# Patient Record
Sex: Female | Born: 1950 | Race: White | Hispanic: No | Marital: Married | Smoking: Former smoker
Health system: Southern US, Community
[De-identification: ages and names within clinical notes are randomized; demographics above are authoritative.]

## PROBLEM LIST (undated history)

## (undated) DIAGNOSIS — R011 Cardiac murmur, unspecified: Secondary | ICD-10-CM

## (undated) DIAGNOSIS — M1990 Unspecified osteoarthritis, unspecified site: Secondary | ICD-10-CM

## (undated) DIAGNOSIS — F32A Depression, unspecified: Secondary | ICD-10-CM

## (undated) DIAGNOSIS — F419 Anxiety disorder, unspecified: Secondary | ICD-10-CM

## (undated) DIAGNOSIS — F329 Major depressive disorder, single episode, unspecified: Secondary | ICD-10-CM

## (undated) DIAGNOSIS — T7840XA Allergy, unspecified, initial encounter: Secondary | ICD-10-CM

## (undated) DIAGNOSIS — I454 Nonspecific intraventricular block: Secondary | ICD-10-CM

## (undated) DIAGNOSIS — K760 Fatty (change of) liver, not elsewhere classified: Secondary | ICD-10-CM

## (undated) DIAGNOSIS — G510 Bell's palsy: Secondary | ICD-10-CM

## (undated) DIAGNOSIS — K219 Gastro-esophageal reflux disease without esophagitis: Secondary | ICD-10-CM

## (undated) DIAGNOSIS — I10 Essential (primary) hypertension: Secondary | ICD-10-CM

## (undated) DIAGNOSIS — M797 Fibromyalgia: Secondary | ICD-10-CM

## (undated) DIAGNOSIS — K259 Gastric ulcer, unspecified as acute or chronic, without hemorrhage or perforation: Secondary | ICD-10-CM

## (undated) DIAGNOSIS — E079 Disorder of thyroid, unspecified: Secondary | ICD-10-CM

## (undated) DIAGNOSIS — K2270 Barrett's esophagus without dysplasia: Secondary | ICD-10-CM

## (undated) HISTORY — DX: Unspecified osteoarthritis, unspecified site: M19.90

## (undated) HISTORY — DX: Depression, unspecified: F32.A

## (undated) HISTORY — DX: Anxiety disorder, unspecified: F41.9

## (undated) HISTORY — PX: BREAST LUMPECTOMY: SHX2

## (undated) HISTORY — DX: Gastro-esophageal reflux disease without esophagitis: K21.9

## (undated) HISTORY — DX: Allergy, unspecified, initial encounter: T78.40XA

## (undated) HISTORY — DX: Cardiac murmur, unspecified: R01.1

## (undated) HISTORY — DX: Major depressive disorder, single episode, unspecified: F32.9

## (undated) HISTORY — PX: CYSTOSCOPY: SUR368

---

## 1998-02-20 ENCOUNTER — Encounter: Admission: RE | Admit: 1998-02-20 | Discharge: 1998-02-20 | Payer: Self-pay | Admitting: *Deleted

## 1998-10-02 ENCOUNTER — Other Ambulatory Visit: Admission: RE | Admit: 1998-10-02 | Discharge: 1998-10-02 | Payer: Self-pay | Admitting: Obstetrics & Gynecology

## 1999-11-18 ENCOUNTER — Other Ambulatory Visit: Admission: RE | Admit: 1999-11-18 | Discharge: 1999-11-18 | Payer: Self-pay | Admitting: Obstetrics & Gynecology

## 1999-12-24 ENCOUNTER — Encounter: Payer: Self-pay | Admitting: Obstetrics & Gynecology

## 1999-12-24 ENCOUNTER — Encounter: Admission: RE | Admit: 1999-12-24 | Discharge: 1999-12-24 | Payer: Self-pay | Admitting: Obstetrics & Gynecology

## 2000-02-21 ENCOUNTER — Other Ambulatory Visit: Admission: RE | Admit: 2000-02-21 | Discharge: 2000-02-21 | Payer: Self-pay | Admitting: Obstetrics & Gynecology

## 2000-11-20 ENCOUNTER — Other Ambulatory Visit: Admission: RE | Admit: 2000-11-20 | Discharge: 2000-11-20 | Payer: Self-pay | Admitting: Obstetrics & Gynecology

## 2001-05-02 ENCOUNTER — Other Ambulatory Visit: Admission: RE | Admit: 2001-05-02 | Discharge: 2001-05-02 | Payer: Self-pay | Admitting: Obstetrics & Gynecology

## 2002-04-24 ENCOUNTER — Other Ambulatory Visit: Admission: RE | Admit: 2002-04-24 | Discharge: 2002-04-24 | Payer: Self-pay | Admitting: Obstetrics & Gynecology

## 2002-09-04 ENCOUNTER — Ambulatory Visit (HOSPITAL_COMMUNITY): Admission: RE | Admit: 2002-09-04 | Discharge: 2002-09-04 | Payer: Self-pay | Admitting: Family Medicine

## 2002-09-04 ENCOUNTER — Encounter: Payer: Self-pay | Admitting: Family Medicine

## 2003-04-30 ENCOUNTER — Ambulatory Visit (HOSPITAL_COMMUNITY): Admission: RE | Admit: 2003-04-30 | Discharge: 2003-04-30 | Payer: Self-pay | Admitting: Family Medicine

## 2003-04-30 ENCOUNTER — Encounter: Payer: Self-pay | Admitting: Family Medicine

## 2004-01-20 ENCOUNTER — Other Ambulatory Visit: Admission: RE | Admit: 2004-01-20 | Discharge: 2004-01-20 | Payer: Self-pay | Admitting: Obstetrics & Gynecology

## 2005-01-25 ENCOUNTER — Other Ambulatory Visit: Admission: RE | Admit: 2005-01-25 | Discharge: 2005-01-25 | Payer: Self-pay | Admitting: Obstetrics & Gynecology

## 2009-12-22 ENCOUNTER — Encounter (INDEPENDENT_AMBULATORY_CARE_PROVIDER_SITE_OTHER): Payer: Self-pay | Admitting: *Deleted

## 2010-02-03 ENCOUNTER — Encounter: Admission: RE | Admit: 2010-02-03 | Discharge: 2010-02-03 | Payer: Self-pay | Admitting: Obstetrics & Gynecology

## 2010-03-05 ENCOUNTER — Encounter: Admission: RE | Admit: 2010-03-05 | Discharge: 2010-03-05 | Payer: Self-pay | Admitting: General Surgery

## 2010-03-05 ENCOUNTER — Ambulatory Visit (HOSPITAL_BASED_OUTPATIENT_CLINIC_OR_DEPARTMENT_OTHER): Admission: RE | Admit: 2010-03-05 | Discharge: 2010-03-05 | Payer: Self-pay | Admitting: General Surgery

## 2010-08-22 ENCOUNTER — Encounter: Payer: Self-pay | Admitting: Family Medicine

## 2010-09-02 NOTE — Letter (Signed)
Summary: Colonoscopy Letter  [facility]  [address] [place], [place] [address]   Phone: [phone]  Fax: [phone]      Dec 22, 2009 MRN: [id number]   [facility] [address] [place], [place]  [address]   Dear Joanna Santana,   According to your medical record, it is time for you to schedule a Colonoscopy. The American Cancer Society recommends this procedure as a method to detect early colon cancer. Patients with a family history of colon cancer, or a personal history of colon polyps or inflammatory bowel disease are at increased risk.  This letter has beeen generated based on the recommendations made at the time of your procedure. If you feel that in your particular situation this may no longer apply, please contact our office.  Please call our office at [phone] to schedule this appointment or to update your records at your earliest convenience.  Thank you for cooperating with [place] to provide you with the very best care possible.   Sincerely,  Hedwig Morton. Juanda Chance, M.D.  [facility] [phone]

## 2010-10-15 LAB — DIFFERENTIAL
Basophils Relative: 1 % (ref 0–1)
Eosinophils Absolute: 0.2 10*3/uL (ref 0.0–0.7)
Lymphocytes Relative: 23 % (ref 12–46)
Monocytes Absolute: 0.7 10*3/uL (ref 0.1–1.0)
Neutro Abs: 5.1 10*3/uL (ref 1.7–7.7)
Neutrophils Relative %: 64 % (ref 43–77)

## 2010-10-15 LAB — CBC
HCT: 42.7 % (ref 36.0–46.0)
Hemoglobin: 14.4 g/dL (ref 12.0–15.0)
MCH: 31.3 pg (ref 26.0–34.0)
MCHC: 33.7 g/dL (ref 30.0–36.0)
MCV: 92.8 fL (ref 78.0–100.0)
WBC: 7.9 10*3/uL (ref 4.0–10.5)

## 2010-10-15 LAB — BASIC METABOLIC PANEL
BUN: 19 mg/dL (ref 6–23)
Creatinine, Ser: 0.58 mg/dL (ref 0.4–1.2)
Potassium: 4.6 mEq/L (ref 3.5–5.1)
Sodium: 138 mEq/L (ref 135–145)

## 2010-10-15 LAB — GLUCOSE, CAPILLARY: Glucose-Capillary: 127 mg/dL — ABNORMAL HIGH (ref 70–99)

## 2011-03-09 ENCOUNTER — Telehealth: Payer: Self-pay | Admitting: *Deleted

## 2011-03-09 NOTE — Telephone Encounter (Signed)
Patient had a colonoscopy 01/28/03 due to family history of colon cancer in a grandparent as well as family history of colon polyps. Patient was found to have hyperplastic colon polyps at that time. Patient is now overdue for a recall colonoscopy. I have left a message for patient to call back.

## 2011-03-09 NOTE — Telephone Encounter (Signed)
-----   Message -----    From: Holli Humbles    Sent: 03/09/2011  11:51 AM      To: [name], CMA  Pt. returned call. She has transferred care and going to [facility] in [place]. Having COL on 03-11-11

## 2011-06-24 ENCOUNTER — Emergency Department (HOSPITAL_BASED_OUTPATIENT_CLINIC_OR_DEPARTMENT_OTHER)
Admission: EM | Admit: 2011-06-24 | Discharge: 2011-06-24 | Disposition: A | Discharge status: Home / Self Care | Payer: BC Managed Care – PPO | Attending: Emergency Medicine | Admitting: Emergency Medicine

## 2011-06-24 ENCOUNTER — Emergency Department (INDEPENDENT_AMBULATORY_CARE_PROVIDER_SITE_OTHER): Payer: BC Managed Care – PPO

## 2011-06-24 DIAGNOSIS — R42 Dizziness and giddiness: Secondary | ICD-10-CM

## 2011-06-24 DIAGNOSIS — I10 Essential (primary) hypertension: Secondary | ICD-10-CM | POA: Insufficient documentation

## 2011-06-24 DIAGNOSIS — R209 Unspecified disturbances of skin sensation: Secondary | ICD-10-CM

## 2011-06-24 DIAGNOSIS — E119 Type 2 diabetes mellitus without complications: Secondary | ICD-10-CM | POA: Insufficient documentation

## 2011-06-24 DIAGNOSIS — H539 Unspecified visual disturbance: Secondary | ICD-10-CM

## 2011-06-24 DIAGNOSIS — E079 Disorder of thyroid, unspecified: Secondary | ICD-10-CM | POA: Insufficient documentation

## 2011-06-24 DIAGNOSIS — Z79899 Other long term (current) drug therapy: Secondary | ICD-10-CM | POA: Insufficient documentation

## 2011-06-24 DIAGNOSIS — G510 Bell's palsy: Secondary | ICD-10-CM

## 2011-06-24 HISTORY — DX: Essential (primary) hypertension: I10

## 2011-06-24 HISTORY — DX: Disorder of thyroid, unspecified: E07.9

## 2011-06-24 LAB — DIFFERENTIAL
Basophils Relative: 0 % (ref 0–1)
Eosinophils Relative: 2 % (ref 0–5)
Monocytes Absolute: 1.1 10*3/uL — ABNORMAL HIGH (ref 0.1–1.0)

## 2011-06-24 LAB — CBC
HCT: 39.9 % (ref 36.0–46.0)
Hemoglobin: 13.7 g/dL (ref 12.0–15.0)
MCHC: 34.3 g/dL (ref 30.0–36.0)
MCV: 90.3 fL (ref 78.0–100.0)
Platelets: 265 10*3/uL (ref 150–400)
WBC: 10.8 10*3/uL — ABNORMAL HIGH (ref 4.0–10.5)

## 2011-06-24 LAB — BASIC METABOLIC PANEL
BUN: 16 mg/dL (ref 6–23)
CO2: 26 mEq/L (ref 19–32)
Creatinine, Ser: 0.5 mg/dL (ref 0.50–1.10)
GFR calc non Af Amer: >90 mL/min (ref >90)
Potassium: 3.5 mEq/L (ref 3.5–5.1)
Sodium: 141 mEq/L (ref 135–145)

## 2011-06-24 MED ORDER — MORPHINE SULFATE 4 MG/ML IJ SOLN
4.0000 mg | Freq: Once | INTRAMUSCULAR | Status: DC
  Filled 2011-06-24: qty 1

## 2011-06-24 MED ORDER — PREDNISONE 20 MG PO TABS: 60.0000 mg | ORAL_TABLET | Freq: Every day | ORAL | Status: AC

## 2011-06-24 MED ORDER — VALACYCLOVIR HCL 1 G PO TABS: 1000.0000 mg | ORAL_TABLET | Freq: Three times a day (TID) | ORAL | Status: AC

## 2011-06-24 MED ORDER — PREDNISONE 20 MG PO TABS: 60.0000 mg | ORAL_TABLET | Freq: Every day | ORAL | Status: DC

## 2011-06-24 NOTE — ED Notes (Signed)
dizziness-being treated for bilateral ear infections-c/o HA, visual disturbance, light headed, left eye not blinking

## 2011-06-25 NOTE — ED Provider Notes (Addendum)
History     CSN: [id number] Arrival date & time: 06/24/2011  4:50 PM   First MD Initiated Contact with Patient 06/24/11 1748      CC: Eye Problem, Headache   HPI  Ho HTN, NIDDM pw multiple complaints. Pt states she has experience b/l ear pain and slightly dec hearing, lightheadedness x several days and recently finished course of abx for "double ear infection". Since that time she has experienced intense pain Lt ear, headache (which she describes as intermittent, gradual onset Lt forehead only). Baseline blurry vision per pt (otherwise noted in nsg note). She is not wearing her bifocals today. States that approx 1-2 hours pta  She began to have difficulty with ?closing her lt eye. Feels that her blinking is "off". Also c/o Lt facial numbness and Lt tongue numbness. No slurred speech, facial droop, or difficulty with swallowing. Denies numbness/tingling/weaknss of her extremities. Ambulatory. No h/o CVA in past. No recent tick bite  Angeline Slim, RN 06/24/2011 16:39  dizziness-being treated for bilateral ear infections-c/o HA, visual disturbance, light headed, left eye not blinking    Past Medical History  Diagnosis Date  . Hypertension   . Diabetes mellitus   . Thyroid disease     Past Surgical History  Procedure Date  . Bladder repair w/ cesarean section     No family history on file.  History  Substance Use Topics  . Smoking status: Never Smoker   . Smokeless tobacco: Not on file  . Alcohol Use: No    OB History    Grav Para Term Preterm Abortions TAB SAB Ect Mult Living                  Review of Systems  All other systems reviewed and are negative.   except as noted HPI   Allergies  Codeine; Ibuprofen; Novocain; and Penicillins cross reactors  Home Medications   Current Outpatient Rx  Name Route Sig Dispense Refill  . ASPIRIN EC 81 MG PO TBEC Oral Take 81 mg by mouth daily.      Marland Kitchen LEVOTHYROXINE SODIUM 50 MCG PO TABS Oral Take 50 mcg by mouth  daily.      Marland Kitchen METFORMIN HCL 500 MG PO TABS Oral Take 500 mg by mouth daily as needed. For high blood sugar     . TELMISARTAN-HCTZ 80-12.5 MG PO TABS Oral Take 1 tablet by mouth daily.      . TRAMADOL HCL 50 MG PO TABS Oral Take 50-100 mg by mouth 3 (three) times daily as needed. For sleep. Maximum dose= 8 tablets per day     . PREDNISONE 20 MG PO TABS Oral Take 3 tablets (60 mg total) by mouth daily. 21 tablet 0  . VALACYCLOVIR HCL 1 G PO TABS Oral Take 1 tablet (1,000 mg total) by mouth 3 (three) times daily. 21 tablet 0    06/24/11 1639 99.6 (37.6) 101 20 ! 176/108 98 200 lb (90.719 kg)  Physical Exam  Nursing note and vitals reviewed. Constitutional: She is oriented to person, place, and time. She appears well-developed.  HENT:  Head: Atraumatic.  Mouth/Throat: Oropharynx is clear and moist.  Eyes: Conjunctivae and EOM are normal. Pupils are equal, round, and reactive to light. Right eye exhibits no discharge. Left eye exhibits no discharge.  Neck: Normal range of motion. Neck supple.  Cardiovascular: Normal rate, regular rhythm, normal heart sounds and intact distal pulses.   Pulmonary/Chest: Effort normal and breath sounds normal. No respiratory distress.  She has no wheezes. She has no rales.  Abdominal: Soft. She exhibits no distension. There is no tenderness. There is no rebound and no guarding.  Musculoskeletal: Normal range of motion.  Neurological: She is alert and oriented to person, place, and time.       CN III-XII intact except as noted Strength 5/5 all extremities No pronator drift Lt facial droop at rest and with talking but smile symmetric Unable to fully resist Lt eye opening Forehead assymetric eyebrow raising, Lt lower than right Tongue midline  Sensation nl V1/2 ? Slightly dec V3 (pt unsure( EOMI   Skin: Skin is warm and dry. No rash noted.  Psychiatric: She has a normal mood and affect.    ED Course  Procedures (including critical care time)  Labs  Reviewed  CBC - Abnormal; Notable for the following:    WBC 10.8 (*)    All other components within normal limits  DIFFERENTIAL - Abnormal; Notable for the following:    Monocytes Absolute 1.1 (*)    All other components within normal limits  BASIC METABOLIC PANEL - Abnormal; Notable for the following:    Glucose, Bld 136 (*)    All other components within normal limits   Ct Head Wo Contrast  06/24/2011  *RADIOLOGY REPORT*  Clinical Data: Left facial numbness with lightheadedness and visual disturbance.  CT HEAD WITHOUT CONTRAST  Technique:  Contiguous axial images were obtained from the base of the skull through the vertex without contrast.  Comparison: None.  Findings: No evidence of acute infarct, acute hemorrhage, mass lesion, mass effect or hydrocephalus.  Visualized portions of the paranasal sinuses and mastoid air cells are clear.  IMPRESSION: No acute intracranial abnormality.  Original Report Authenticated By: Reyes Ivan, M.D.     1. Bell's palsy       MDM  Concern for Atypical bell's palsy. The Patient's si/sx have progressed since arrival to be more c/w Bell's palsy. DDx inc acoustic neuroma, less likely CVA, less likely cavernous sinus thrombosis. Her headache is intermittent and she is declining analgesia here. Her BP and mild tachycardia improved without intervention.  BP 123/79  Pulse 70  Temp(Src) 98.9 F (37.2 C) (Oral)  Resp 16  Ht 5\' 1"  (1.549 m)  Wt 200 lb (90.719 kg)  BMI 37.79 kg/m2  SpO2 96%   I have discussed the case with neurology on call. Agree would be atypical for CVA. Continue ASA 81mg  daily. Outpatient MRI. Patient comfortable with plan. Will come in tomorrow morning for MRI. Given strict precautions for return. Acyclovir, prednisone. Will monitor her glucose carefully at home. Her eye feels dry. Given eye patch and advised re: lubricating Lt eye. Patient and husband comfortable with the plan.          Forbes Cellar, MD 06/25/11  1610  Forbes Cellar, MD 06/25/11 [phone]

## 2011-07-04 ENCOUNTER — Other Ambulatory Visit: Payer: Self-pay | Admitting: Family Medicine

## 2011-07-08 ENCOUNTER — Other Ambulatory Visit: Payer: BC Managed Care – PPO

## 2011-09-27 ENCOUNTER — Other Ambulatory Visit: Payer: Self-pay | Admitting: Obstetrics & Gynecology

## 2011-09-27 DIAGNOSIS — N644 Mastodynia: Secondary | ICD-10-CM

## 2011-10-05 ENCOUNTER — Ambulatory Visit
Admission: RE | Admit: 2011-10-05 | Discharge: 2011-10-05 | Disposition: A | Discharge status: Home / Self Care | Payer: BC Managed Care – PPO | Source: Ambulatory Visit | Attending: Obstetrics & Gynecology | Admitting: Obstetrics & Gynecology

## 2011-10-05 ENCOUNTER — Other Ambulatory Visit: Payer: Self-pay | Admitting: Obstetrics & Gynecology

## 2011-10-05 DIAGNOSIS — N644 Mastodynia: Secondary | ICD-10-CM

## 2011-12-08 ENCOUNTER — Other Ambulatory Visit: Payer: Self-pay | Admitting: Otolaryngology

## 2011-12-08 DIAGNOSIS — R220 Localized swelling, mass and lump, head: Secondary | ICD-10-CM

## 2011-12-14 ENCOUNTER — Ambulatory Visit
Admission: RE | Admit: 2011-12-14 | Discharge: 2011-12-14 | Disposition: A | Discharge status: Home / Self Care | Payer: BC Managed Care – PPO | Source: Ambulatory Visit | Attending: Otolaryngology | Admitting: Otolaryngology

## 2011-12-14 DIAGNOSIS — R221 Localized swelling, mass and lump, neck: Secondary | ICD-10-CM

## 2011-12-14 MED ORDER — IOHEXOL 300 MG/ML  SOLN
75.0000 mL | Freq: Once | INTRAMUSCULAR | Status: AC | PRN
  Administered 2011-12-14: 75 mL via INTRAVENOUS

## 2012-06-05 ENCOUNTER — Other Ambulatory Visit: Payer: Self-pay | Admitting: Family Medicine

## 2012-08-06 ENCOUNTER — Other Ambulatory Visit: Payer: BC Managed Care – PPO

## 2014-04-21 ENCOUNTER — Ambulatory Visit
Admission: RE | Admit: 2014-04-21 | Discharge: 2014-04-21 | Disposition: A | Discharge status: Home / Self Care | Payer: BC Managed Care – PPO | Source: Ambulatory Visit | Attending: Family Medicine | Admitting: Family Medicine

## 2014-04-21 ENCOUNTER — Other Ambulatory Visit: Payer: Self-pay | Admitting: Family Medicine

## 2014-04-21 DIAGNOSIS — M25512 Pain in left shoulder: Secondary | ICD-10-CM

## 2014-05-09 ENCOUNTER — Emergency Department (HOSPITAL_BASED_OUTPATIENT_CLINIC_OR_DEPARTMENT_OTHER)
Admission: EM | Admit: 2014-05-09 | Discharge: 2014-05-09 | Disposition: A | Discharge status: Home / Self Care | Payer: BC Managed Care – PPO | Attending: Emergency Medicine | Admitting: Emergency Medicine

## 2014-05-09 ENCOUNTER — Encounter (HOSPITAL_BASED_OUTPATIENT_CLINIC_OR_DEPARTMENT_OTHER): Payer: Self-pay | Admitting: Emergency Medicine

## 2014-05-09 ENCOUNTER — Emergency Department (HOSPITAL_BASED_OUTPATIENT_CLINIC_OR_DEPARTMENT_OTHER): Payer: BC Managed Care – PPO

## 2014-05-09 DIAGNOSIS — Z8719 Personal history of other diseases of the digestive system: Secondary | ICD-10-CM | POA: Insufficient documentation

## 2014-05-09 DIAGNOSIS — I10 Essential (primary) hypertension: Secondary | ICD-10-CM | POA: Diagnosis not present

## 2014-05-09 DIAGNOSIS — Z79899 Other long term (current) drug therapy: Secondary | ICD-10-CM | POA: Insufficient documentation

## 2014-05-09 DIAGNOSIS — Z87891 Personal history of nicotine dependence: Secondary | ICD-10-CM | POA: Insufficient documentation

## 2014-05-09 DIAGNOSIS — Z8669 Personal history of other diseases of the nervous system and sense organs: Secondary | ICD-10-CM | POA: Diagnosis not present

## 2014-05-09 DIAGNOSIS — R519 Headache, unspecified: Secondary | ICD-10-CM

## 2014-05-09 DIAGNOSIS — R51 Headache: Secondary | ICD-10-CM | POA: Diagnosis not present

## 2014-05-09 DIAGNOSIS — H9313 Tinnitus, bilateral: Secondary | ICD-10-CM | POA: Diagnosis not present

## 2014-05-09 DIAGNOSIS — Z880 Allergy status to penicillin: Secondary | ICD-10-CM | POA: Insufficient documentation

## 2014-05-09 DIAGNOSIS — E119 Type 2 diabetes mellitus without complications: Secondary | ICD-10-CM | POA: Diagnosis not present

## 2014-05-09 DIAGNOSIS — Z7982 Long term (current) use of aspirin: Secondary | ICD-10-CM | POA: Diagnosis not present

## 2014-05-09 DIAGNOSIS — E079 Disorder of thyroid, unspecified: Secondary | ICD-10-CM | POA: Diagnosis not present

## 2014-05-09 HISTORY — DX: Nonspecific intraventricular block: I45.4

## 2014-05-09 HISTORY — DX: Fibromyalgia: M79.7

## 2014-05-09 HISTORY — DX: Fatty (change of) liver, not elsewhere classified: K76.0

## 2014-05-09 HISTORY — DX: Bell's palsy: G51.0

## 2014-05-09 HISTORY — DX: Gastric ulcer, unspecified as acute or chronic, without hemorrhage or perforation: K25.9

## 2014-05-09 HISTORY — DX: Barrett's esophagus without dysplasia: K22.70

## 2014-05-09 NOTE — ED Notes (Signed)
Patient transported to CT 

## 2014-05-09 NOTE — ED Provider Notes (Signed)
CSN: [id number]     Arrival date & time 05/09/14  1314 History   First MD Initiated Contact with Patient 05/09/14 1342     Chief Complaint  Patient presents with  . Headache    Head pressure     (Consider location/radiation/quality/duration/timing/severity/associated sxs/prior Treatment) Patient is a 63 y.o. female presenting with headaches. The history is provided by the patient.  Headache Pain location:  Generalized Quality: pressure. Severity currently:  0/10 Severity at highest:  10/10 Onset quality:  Sudden Duration: several seconds. Timing:  Constant Progression:  Resolved Chronicity:  Recurrent Similar to prior headaches: yes (happened once 2 weeks ago, simlar to that episode, but less intense)   Relieved by:  Nothing Worsened by:  Nothing tried Associated symptoms: no abdominal pain, no cough, no diarrhea, no dizziness, no fatigue, no fever, no neck stiffness, no numbness and no vomiting     Past Medical History  Diagnosis Date  . Hypertension   . Diabetes mellitus   . Thyroid disease   . Fibromyalgia   . Bell's palsy   . Gastric ulcer   . Barrett's esophagus   . Fatty liver   . BBB (bundle branch block)     right   Past Surgical History  Procedure Laterality Date  . Cystoscopy    . Cesarean section     No family history on file. History  Substance Use Topics  . Smoking status: Former Games developermoker  . Smokeless tobacco: Not on file  . Alcohol Use: No   OB History   Grav Para Term Preterm Abortions TAB SAB Ect Mult Living                 Review of Systems  Constitutional: Negative for fever, chills and fatigue.  Respiratory: Negative for cough.   Cardiovascular: Negative for chest pain and leg swelling.  Gastrointestinal: Negative for vomiting, abdominal pain and diarrhea.  Musculoskeletal: Negative for neck stiffness.  Neurological: Positive for headaches. Negative for dizziness and numbness.  All other systems reviewed and are  negative.     Allergies  Codeine; Ibuprofen; Penicillins cross reactors; and Procaine hcl  Home Medications   Prior to Admission medications   Medication Sig Start Date End Date Taking? Authorizing Provider  levothyroxine (SYNTHROID, LEVOTHROID) 50 MCG tablet Take 50 mcg by mouth daily.     Yes Historical Provider, MD  metFORMIN (GLUCOPHAGE) 500 MG tablet Take 500 mg by mouth daily as needed. For high blood sugar    Yes Historical Provider, MD  OMEPRAZOLE PO Take by mouth.   Yes Historical Provider, MD  telmisartan-hydrochlorothiazide (MICARDIS HCT) 80-12.5 MG per tablet Take 1 tablet by mouth daily.     Yes Historical Provider, MD  TIZANIDINE HCL PO Take by mouth.   Yes Historical Provider, MD  traMADol (ULTRAM) 50 MG tablet Take 50-100 mg by mouth 3 (three) times daily as needed. For sleep. Maximum dose= 8 tablets per day    Yes Historical Provider, MD  aspirin EC 81 MG tablet Take 81 mg by mouth daily.      Historical Provider, MD   BP 147/85  Pulse 93  Temp(Src) 98.2 F (36.8 C) (Oral)  Resp 18  Ht 5\' 1"  (1.549 m)  Wt 200 lb (90.719 kg)  BMI 37.81 kg/m2  SpO2 98% Physical Exam  Nursing note and vitals reviewed. Constitutional: She is oriented to person, place, and time. She appears well-developed and well-nourished. No distress.  HENT:  Head: Normocephalic and atraumatic.  Mouth/Throat: Oropharynx  is clear and moist.  Eyes: EOM are normal. Pupils are equal, round, and reactive to light.  Neck: Normal range of motion. Neck supple.  Cardiovascular: Normal rate and regular rhythm.  Exam reveals no friction rub.   No murmur heard. Pulmonary/Chest: Effort normal and breath sounds normal. No respiratory distress. She has no wheezes. She has no rales.  Abdominal: Soft. She exhibits no distension. There is no tenderness. There is no rebound.  Musculoskeletal: Normal range of motion. She exhibits no edema.  Neurological: She is alert and oriented to person, place, and time.   Skin: No rash noted. She is not diaphoretic.    ED Course  Procedures (including critical care time) Labs Review Labs Reviewed - No data to display  Imaging Review No results found.   EKG Interpretation None      MDM   Final diagnoses:  Acute nonintractable headache, unspecified headache type    89F here with headache. Lasted a few seconds today.  Described as intense pressure sensation with some roaring in her ears. Began while talking on the phone today. Lasted a few seconds, followed by generalized chill than spread down from her neck into the rest of her body. Patient had similar episode 2 weeks ago during the middle of the night. This episode today was not as intense as the one 2 weeks ago. Denies any dizziness, blurry vision, CP, SOB. Here states she feels well, no issues. Normal Neuro exam. Will do Head CT to look for possible SAH.  CT ok. Within 6 hrs of HA onset, no need for LP. Counseled on return precautions, f/u plan. Patient feeling great now, no need for further testing. Stable for discharge.   Elwin MochaBlair Jim Lundin, MD 05/09/14 1444

## 2014-05-09 NOTE — Discharge Instructions (Signed)

## 2014-05-09 NOTE — ED Notes (Signed)
Patient states she had a sudden onset of bilateral ear pain with  head pressure which floated down her chest and disappeared.  Lasted only a few seconds.  States she had a similar experience two weeks ago and discussed with her pcp, and was told to come to the ed if it happens again.  States she had Bell's palsey three years ago and has had problems with her ears every since, and has had left ear pain for the last few weeks.

## 2014-06-04 ENCOUNTER — Encounter: Payer: Self-pay | Admitting: Neurology

## 2014-06-04 ENCOUNTER — Ambulatory Visit (INDEPENDENT_AMBULATORY_CARE_PROVIDER_SITE_OTHER): Payer: BC Managed Care – PPO | Admitting: Neurology

## 2014-06-04 VITALS — BP 124/76 | HR 92 | Ht 61.5 in | Wt 210.0 lb

## 2014-06-04 DIAGNOSIS — H9312 Tinnitus, left ear: Secondary | ICD-10-CM

## 2014-06-04 DIAGNOSIS — R519 Headache, unspecified: Secondary | ICD-10-CM

## 2014-06-04 DIAGNOSIS — R51 Headache: Secondary | ICD-10-CM

## 2014-06-04 DIAGNOSIS — H9202 Otalgia, left ear: Secondary | ICD-10-CM

## 2014-06-04 DIAGNOSIS — Z8669 Personal history of other diseases of the nervous system and sense organs: Secondary | ICD-10-CM

## 2014-06-04 NOTE — Patient Instructions (Addendum)
I really can't say what the cause of your symptoms are.  It may be related to muscle tension in the neck or residual pain from bell's palsy.  We will geset MRI of the brain with and without contrast, MRA of the head without contrast and MRA of the neck with contrast to rule out other secondary causes.  I want to see you back in 4 to 6 weeks for re-evaluation.  You may take a benzodiazepam prior to the study but you must have somebody drive you.

## 2014-06-04 NOTE — Progress Notes (Signed)
NEUROLOGY CONSULTATION NOTE  Joanna Santana MRN: [id number] DOB: [birth year]  Referring provider: Dr. Tiburcio PeaHarris Primary care provider: Dr. Tiburcio PeaHarris  Reason for consult:  Headache  HISTORY OF PRESENT ILLNESS: Joanna Santana is a [age] year old right-handed woman with history of hypertension, type II diabetes, hypothyroidism, fibromyalgia, Bell's palsy, gastric ulcer, RBBB and fatty liver who presents for headache.  ED records reviewed.  On 04/18/14, she was laying in bed in the middle of the night when suddenly she developed this intense sensation on both sides of the neck which travelled up to the ears and jaw.  It wasn't a pain but rather a wave-like, whooshing sensation.  It was associated with aural fullness, muffled sounds, and ear pain.  It lasted only seconds.  It really startled her.  There was no associated numbness, weakness, dizziness or visual symptoms.  She had another episode on 05/09/14.  It occurred while she was talking on the telephone.  It was not as intense.  She went to the ED.  She had a CT of the head performed, which was negative for subarachnoid bleed, acute infarct or mass lesions.  She was feeling fine, so she was discharged.  She had another episode on 05/14/14.  It was associated with a stinging left earache and sensation of fluttering in the left ear.  She also reports ringing in the ears.  She went to the ENT and she had a normal evaluation.  She had a mild episode a few days ago.  She had an episode of left Bell's palsy in November 2012.  She had facial weakness lasting for a year.  She also has fibromyalgia, which includes pain in the neck.  For several months, she has been receiving alpha stim therapy to help her sleep and control the pain.  She was evaluated by her PT and was told that her sternocleidomastoid muscles were very tight.  Occasionally, she gets muscle spasms in her sternocleidomastoid muscles.  She denies any preceding viral illness.  PAST MEDICAL  HISTORY: Past Medical History  Diagnosis Date  . Hypertension   . Diabetes mellitus   . Thyroid disease   . Fibromyalgia   . Bell's palsy   . Gastric ulcer   . Barrett's esophagus   . Fatty liver   . BBB (bundle branch block)     right    PAST SURGICAL HISTORY: Past Surgical History  Procedure Laterality Date  . Cystoscopy    . Cesarean section    . Breast lumpectomy      benign    MEDICATIONS: Current Outpatient Prescriptions on File Prior to Visit  Medication Sig Dispense Refill  . metFORMIN (GLUCOPHAGE) 500 MG tablet Take 1,000 mg by mouth 2 (two) times daily with a meal. For high blood sugar    . OMEPRAZOLE PO Take 40 mg by mouth daily.     Marland Kitchen. TIZANIDINE HCL PO Take 2 mg by mouth daily.     . traMADol (ULTRAM) 50 MG tablet Take 50-100 mg by mouth 3 (three) times daily as needed. For sleep. Maximum dose= 8 tablets per day      No current facility-administered medications on file prior to visit.    ALLERGIES: Allergies  Allergen Reactions  . Codeine Anxiety and Rash  . Ibuprofen Rash  . Penicillins Cross Reactors Rash  . Procaine Hcl Palpitations    FAMILY HISTORY: No family history on file.  SOCIAL HISTORY: History   Social History  . Marital Status: Married  Spouse Name: N/A    Number of Children: N/A  . Years of Education: N/A   Occupational History  . Not on file.   Social History Main Topics  . Smoking status: Former Smoker    Quit date: 06/04/1994  . Smokeless tobacco: Not on file  . Alcohol Use: No  . Drug Use: No  . Sexual Activity: Not on file   Other Topics Concern  . Not on file   Social History Narrative    REVIEW OF SYSTEMS: Constitutional: No fevers, chills, or sweats, no generalized fatigue, change in appetite Eyes: No visual changes, double vision, eye pain Ear, nose and throat: No hearing loss, ear pain, nasal congestion, sore throat Cardiovascular: No chest pain, palpitations Respiratory:  No shortness of breath at  rest or with exertion, wheezes GastrointestinaI: No nausea, vomiting, diarrhea, abdominal pain, fecal incontinence Genitourinary:  No dysuria, urinary retention or frequency Musculoskeletal:  Generalized pain Integumentary: No rash, pruritus, skin lesions Neurological: as above Psychiatric: No depression, insomnia, anxiety Endocrine: No palpitations, fatigue, diaphoresis, mood swings, change in appetite, change in weight, increased thirst Hematologic/Lymphatic:  No anemia, purpura, petechiae. Allergic/Immunologic: no itchy/runny eyes, nasal congestion, recent allergic reactions, rashes  PHYSICAL EXAM: Filed Vitals:   06/04/14 0800  BP: 124/76  Pulse: 92   General: No acute distress Head:  Normocephalic/atraumatic Neck: supple, no paraspinal tenderness, full range of motion Back: No paraspinal tenderness Heart: regular rate and rhythm Lungs: Clear to auscultation bilaterally. Vascular: No carotid bruits. Neurological Exam: Mental status: alert and oriented to person, place, and time, recent and remote memory intact, fund of knowledge intact, attention and concentration intact, speech fluent and not dysarthric, language intact. Cranial nerves: CN I: not tested CN II: pupils equal, round and reactive to light, visual fields intact, fundi unremarkable, without vessel changes, exudates, hemorrhages or papilledema. CN III, IV, VI:  full range of motion, no nystagmus, no ptosis CN V: facial sensation intact CN VII: upper and lower face symmetric CN VIII: hearing intact CN IX, X: gag intact, uvula midline CN XI: sternocleidomastoid and trapezius muscles intact CN XII: tongue midline Bulk & Tone: normal, no fasciculations. Motor: 5/5 throughout Sensation:  Temperature and vibration intact Deep Tendon Reflexes:  2+ throughout, toes downgoing Finger to nose testing: no dysmetria Heel to shin:  No dysmetria Gait:  Normal station and stride.  Able to turn and walk in tandem. Romberg  negative.  IMPRESSION: Unusual sensation of the head, mostly on the left side, associated with earache and tinnitus.  It is not really a headache.  I cannot really classify it.  It could possibly be due to muscle spasms in the neck or neuralgia involving the left ear related to prior Bell's palsy, causing radiating discomfort Tinnitus Left earache Pulsatile tinnitus History of left Bell's palsy  PLAN: I would like to rule out secondary causes of headache and pulsatile tinnitus.  We will get MRI of the brain with and without contrast, MRA of the head and MRA of the neck.  She will follow up afterwards.    45 minutes spent with patient, over 50% spent discussing etiologies and coordinating care.  Thank you for allowing me to take part in the care of this patient.  Shon MilletAdam Fard Borunda, DO  CC:  Johny BlamerWilliam Harris, MD

## 2014-06-13 ENCOUNTER — Other Ambulatory Visit: Payer: BC Managed Care – PPO

## 2014-06-17 ENCOUNTER — Telehealth: Payer: Self-pay | Admitting: Neurology

## 2014-06-17 NOTE — Telephone Encounter (Signed)
Pt called regarding the MRI she is [facility] to [facility]. Pt wants to know if she can be scheduled at [facility] for her to have an Open MRI. She also has questions and concerns to go over regarding her MRI. Please call pt # [phone]

## 2014-06-17 NOTE — Telephone Encounter (Signed)
Patient is not sure is she wants to have MRI MRA's done due to not wanting to stay in machine as long as needed  She is going to go by [facility] and look at there machine and call insurance and see if she can have it done on 3 different days she will call me back and let me know

## 2014-06-24 ENCOUNTER — Telehealth: Payer: Self-pay | Admitting: Neurology

## 2014-06-24 NOTE — Telephone Encounter (Signed)
Pt called wanting to speak to a nurse regarding her upcoming MRA & MRI. Please call pt # [phone]

## 2014-07-02 ENCOUNTER — Inpatient Hospital Stay: Admission: RE | Admit: 2014-07-02 | Payer: BC Managed Care – PPO | Source: Ambulatory Visit

## 2014-07-02 ENCOUNTER — Other Ambulatory Visit: Payer: BC Managed Care – PPO

## 2014-07-05 ENCOUNTER — Other Ambulatory Visit: Payer: BC Managed Care – PPO

## 2014-07-30 ENCOUNTER — Telehealth: Payer: Self-pay | Admitting: *Deleted

## 2014-07-30 NOTE — Telephone Encounter (Signed)
Patient canceled follow up appointment will call to reschedule at a later time 

## 2014-07-30 NOTE — Telephone Encounter (Signed)
See below note.

## 2014-08-04 ENCOUNTER — Ambulatory Visit: Payer: BC Managed Care – PPO | Admitting: Neurology

## 2014-10-07 ENCOUNTER — Ambulatory Visit (INDEPENDENT_AMBULATORY_CARE_PROVIDER_SITE_OTHER): Payer: BC Managed Care – PPO | Admitting: Physician Assistant

## 2014-10-07 VITALS — BP 99/82 | HR 96 | Temp 98.9°F | Resp 18 | Ht 62.0 in | Wt 210.0 lb

## 2014-10-07 DIAGNOSIS — I10 Essential (primary) hypertension: Secondary | ICD-10-CM | POA: Insufficient documentation

## 2014-10-07 DIAGNOSIS — R0981 Nasal congestion: Secondary | ICD-10-CM

## 2014-10-07 DIAGNOSIS — E119 Type 2 diabetes mellitus without complications: Secondary | ICD-10-CM | POA: Insufficient documentation

## 2014-10-07 DIAGNOSIS — E039 Hypothyroidism, unspecified: Secondary | ICD-10-CM | POA: Insufficient documentation

## 2014-10-07 DIAGNOSIS — K2270 Barrett's esophagus without dysplasia: Secondary | ICD-10-CM | POA: Insufficient documentation

## 2014-10-07 DIAGNOSIS — J029 Acute pharyngitis, unspecified: Secondary | ICD-10-CM | POA: Diagnosis not present

## 2014-10-07 DIAGNOSIS — D72829 Elevated white blood cell count, unspecified: Secondary | ICD-10-CM

## 2014-10-07 DIAGNOSIS — M797 Fibromyalgia: Secondary | ICD-10-CM | POA: Insufficient documentation

## 2014-10-07 LAB — POCT CBC
Granulocyte percent: 70.3 %G (ref 37–80)
HEMATOCRIT: 40.8 % (ref 37.7–47.9)
HEMOGLOBIN: 13.6 g/dL (ref 12.2–16.2)
Lymph, poc: 2.6 (ref 0.6–3.4)
MCH: 30.7 pg (ref 27–31.2)
MCHC: 33.4 g/dL (ref 31.8–35.4)
MCV: 91.9 fL (ref 80–97)
MID (cbc): 0.8 (ref 0–0.9)
MPV: 7.3 fL (ref 0–99.8)
POC Granulocyte: 8 — AB (ref 2–6.9)
POC LYMPH PERCENT: 23 %L (ref 10–50)
POC MID %: 6.7 %M (ref 0–12)
Platelet Count, POC: 306 10*3/uL (ref 142–424)
RBC: 4.44 M/uL (ref 4.04–5.48)
RDW, POC: 13.5 %
WBC: 11.4 10*3/uL — AB (ref 4.6–10.2)

## 2014-10-07 LAB — POCT RAPID STREP A (OFFICE): RAPID STREP A SCREEN: NEGATIVE

## 2014-10-07 MED ORDER — AZITHROMYCIN 250 MG PO TABS: 250.0000 mg | ORAL_TABLET | Freq: Every day | ORAL | Status: AC

## 2014-10-07 MED ORDER — TRIAMCINOLONE ACETONIDE 55 MCG/ACT NA AERO: 2.0000 | INHALATION_SPRAY | Freq: Every day | NASAL | Status: AC

## 2014-10-07 NOTE — Progress Notes (Signed)
Subjective:    Patient ID: Joanna Santana, female    DOB: 1951/01/18, 64 y.o.   MRN: [id number]  HPI   Sick for about 1 week - started with a scratchy throat that has not improved over the last week and then this am her throat is burning and raw.  She has some mild PND and nasal congestion and over the last 3 days she has had a dry cough.  She has not had exposure to strep that she knows of but she does hug many people at church on Sundays so she may have been exposed to something that she does not know about.  She has some problems with allergies and yesterday prior to her throat getting worse she did work in th yard.  She does not take anything for her allergies.  She has h/o fibromyalgia and is currently not working due to this condition.  She sees her PCP for treatment.  She had to be taken off her Lyrica due to increase in LFTs and she thinks she has been on Cymbalta but she stopped it from some reason.  She has an EGD on Friday and she wonders if she will be able to go due to this illness.  Patient Active Problem List   Diagnosis Date Noted  . Barrett's esophagus 10/07/2014  . Hypothyroidism 10/07/2014  . Benign essential HTN 10/07/2014  . Diabetes type 2, controlled 10/07/2014  . Fibromyalgia 10/07/2014   Prior to Admission medications   Medication Sig Start Date End Date Taking? Authorizing Provider  levothyroxine (SYNTHROID, LEVOTHROID) 75 MCG tablet Take 75 mcg by mouth daily before breakfast.   Yes Historical Provider, MD  metFORMIN (GLUCOPHAGE) 500 MG tablet Take 1,000 mg by mouth 2 (two) times daily with a meal. For high blood sugar   Yes Historical Provider, MD  OMEPRAZOLE PO Take 40 mg by mouth daily.    Yes Historical Provider, MD  TIZANIDINE HCL PO Take 2 mg by mouth daily.    Yes Historical Provider, MD  traMADol (ULTRAM) 50 MG tablet Take 50-100 mg by mouth 3 (three) times daily as needed. For sleep. Maximum dose= 8 tablets per day    Yes Historical Provider, MD    valsartan-hydrochlorothiazide (DIOVAN-HCT) 160-25 MG per tablet Take 1 tablet by mouth daily.   Yes Historical Provider, MD   Allergies  Allergen Reactions  . Codeine Anxiety and Rash  . Ibuprofen Rash  . Penicillins Cross Reactors Rash  . Procaine Hcl Palpitations    Medications, allergies, past medical history, surgical history, family history, social history and problem list reviewed and updated.  Review of Systems  Constitutional: Negative for fever and chills.  HENT: Positive for congestion, rhinorrhea and sore throat. Negative for postnasal drip.   Respiratory: Positive for cough.   Cardiovascular: Negative for chest pain.  Gastrointestinal: Negative for nausea and vomiting.  Musculoskeletal: Positive for myalgias (normal for her).  Neurological: Positive for headaches (mild).       Objective:   Physical Exam  Constitutional: She is oriented to person, place, and time. She appears well-developed and well-nourished.  BP 99/82 mmHg  Pulse 96  Temp(Src) 98.9 F (37.2 C) (Oral)  Resp 18  Ht 5\' 2"  (1.575 m)  Wt 210 lb (95.255 kg)  BMI 38.40 kg/m2  SpO2 97%   HENT:  Head: Normocephalic and atraumatic.  Right Ear: Hearing, tympanic membrane, external ear and ear canal normal.  Left Ear: Hearing, tympanic membrane, external ear and ear canal normal.  Nose: Mucosal edema (red) present.  Mouth/Throat: Uvula is midline and mucous membranes are normal. Posterior oropharyngeal edema present. No oropharyngeal exudate or posterior oropharyngeal erythema.  Eyes: Conjunctivae are normal.  Cardiovascular: Normal rate, regular rhythm and normal heart sounds.   No murmur heard. Pulmonary/Chest: Effort normal and breath sounds normal. She has no wheezes.  Neurological: She is alert and oriented to person, place, and time.  Skin: Skin is warm and dry.  Psychiatric: She has a normal mood and affect. Her behavior is normal. Judgment and thought content normal.   Results for orders  placed or performed in visit on 10/07/14  POCT rapid strep A  Result Value Ref Range   Rapid Strep A Screen Negative Negative  POCT CBC  Result Value Ref Range   WBC 11.4 (A) 4.6 - 10.2 K/uL   Lymph, poc 2.6 0.6 - 3.4   POC LYMPH PERCENT 23.0 10 - 50 %L   MID (cbc) 0.8 0 - 0.9   POC MID % 6.7 0 - 12 %M   POC Granulocyte 8.0 (A) 2 - 6.9   Granulocyte percent 70.3 37 - 80 %G   RBC 4.44 4.04 - 5.48 M/uL   Hemoglobin 13.6 12.2 - 16.2 g/dL   HCT, POC 16.1 09.6 - 47.9 %   MCV 91.9 80 - 97 fL   MCH, POC 30.7 27 - 31.2 pg   MCHC 33.4 31.8 - 35.4 g/dL   RDW, POC 04.5 %   Platelet Count, POC 306 142 - 424 K/uL   MPV 7.3 0 - 99.8 fL       Assessment & Plan:  Leukocytosis-   Sore throat - Plan: POCT rapid strep A, POCT CBC, Culture, Group A Strep, azithromycin (ZITHROMAX) 250 MG tablet  Nasal congestion - Plan: triamcinolone (NASACORT AQ) 55 MCG/ACT AERO nasal inhaler  We are going to cover for strep due to elevated white count but also sinus symptoms.  I think there is some component of allergies and we will treat with Nasacort as a result.  She will push fluids.  We talked about other treatments for her fibromyalgia that she plans to talk to her PCP about at her next appt.  Benny Lennert PA-C  [facility] 10/07/2014 12:57 PM

## 2014-10-07 NOTE — Patient Instructions (Addendum)
Gabapentin - to help with nerve pain - like lyrica  Push fluids  Tylenol to help with sore throat

## 2014-10-09 LAB — CULTURE, GROUP A STREP: ORGANISM ID, BACTERIA: NORMAL

## 2014-11-10 ENCOUNTER — Other Ambulatory Visit: Payer: Self-pay | Admitting: Obstetrics & Gynecology

## 2014-11-11 LAB — CYTOLOGY - PAP

## 2014-12-19 ENCOUNTER — Other Ambulatory Visit: Payer: Self-pay | Admitting: Family Medicine

## 2014-12-19 DIAGNOSIS — H9202 Otalgia, left ear: Secondary | ICD-10-CM

## 2014-12-24 ENCOUNTER — Other Ambulatory Visit: Payer: BC Managed Care – PPO

## 2015-01-01 ENCOUNTER — Other Ambulatory Visit: Payer: BC Managed Care – PPO

## 2015-01-06 ENCOUNTER — Other Ambulatory Visit: Payer: BC Managed Care – PPO

## 2015-01-06 ENCOUNTER — Ambulatory Visit
Admission: RE | Admit: 2015-01-06 | Discharge: 2015-01-06 | Disposition: A | Discharge status: Home / Self Care | Payer: BC Managed Care – PPO | Source: Ambulatory Visit | Attending: Family Medicine | Admitting: Family Medicine

## 2015-01-06 DIAGNOSIS — H9202 Otalgia, left ear: Secondary | ICD-10-CM

## 2016-05-31 DIAGNOSIS — K219 Gastro-esophageal reflux disease without esophagitis: Secondary | ICD-10-CM | POA: Diagnosis not present

## 2016-05-31 DIAGNOSIS — K2270 Barrett's esophagus without dysplasia: Secondary | ICD-10-CM | POA: Diagnosis not present

## 2016-07-06 DIAGNOSIS — R09A2 Foreign body sensation, throat: Secondary | ICD-10-CM | POA: Insufficient documentation

## 2016-07-06 DIAGNOSIS — K219 Gastro-esophageal reflux disease without esophagitis: Secondary | ICD-10-CM | POA: Insufficient documentation

## 2016-07-07 DIAGNOSIS — M797 Fibromyalgia: Secondary | ICD-10-CM | POA: Diagnosis not present

## 2016-07-07 DIAGNOSIS — L409 Psoriasis, unspecified: Secondary | ICD-10-CM | POA: Diagnosis not present

## 2016-07-07 DIAGNOSIS — N183 Chronic kidney disease, stage 3 (moderate): Secondary | ICD-10-CM | POA: Diagnosis not present

## 2016-07-07 DIAGNOSIS — E039 Hypothyroidism, unspecified: Secondary | ICD-10-CM | POA: Diagnosis not present

## 2016-07-07 DIAGNOSIS — I10 Essential (primary) hypertension: Secondary | ICD-10-CM | POA: Diagnosis not present

## 2016-07-07 DIAGNOSIS — Z7984 Long term (current) use of oral hypoglycemic drugs: Secondary | ICD-10-CM | POA: Diagnosis not present

## 2016-07-07 DIAGNOSIS — E7800 Pure hypercholesterolemia, unspecified: Secondary | ICD-10-CM | POA: Diagnosis not present

## 2016-07-07 DIAGNOSIS — E1165 Type 2 diabetes mellitus with hyperglycemia: Secondary | ICD-10-CM | POA: Diagnosis not present

## 2016-07-07 DIAGNOSIS — K219 Gastro-esophageal reflux disease without esophagitis: Secondary | ICD-10-CM | POA: Diagnosis not present

## 2016-09-15 IMAGING — CT CT TEMPORAL BONES W/O CM
3 of 6 series · 16 of 30 positions shown, 18 images · non-contrast
Comparison: CT head 05/09/2014

CLINICAL DATA: Left ear pain

EXAM:
CT TEMPORAL BONES WITHOUT CONTRAST
TECHNIQUE: Axial and coronal plane CT imaging of the petrous temporal bones was
performed with thin-collimation image reconstruction. No intravenous
contrast was administered. Multiplanar CT image reconstructions were
also generated.

[Series 4: ax mag left · axial · 0.20mm/px · z∈[-22,+5]mm · 4 of 147 slices shown]
[im 30/147  bone]
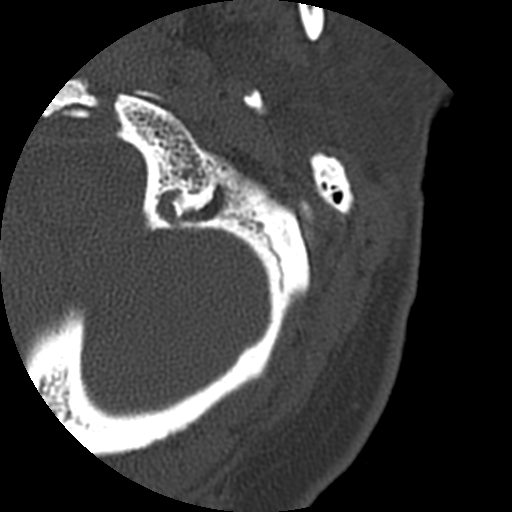
[im 59/147  bone]
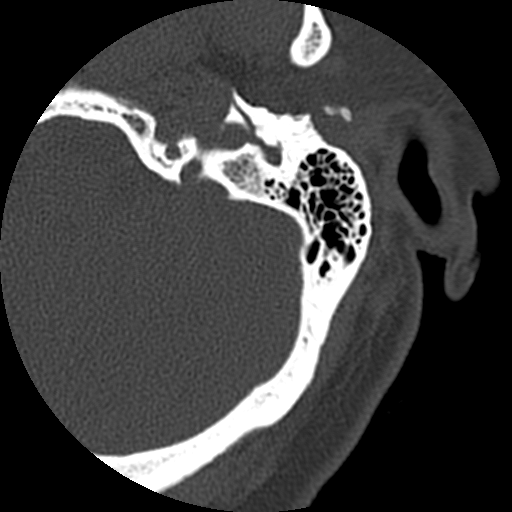
[im 88/147  bone]
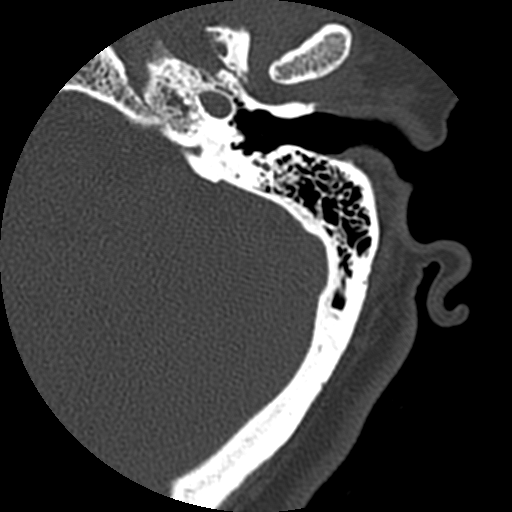
[im 117/147  bone]
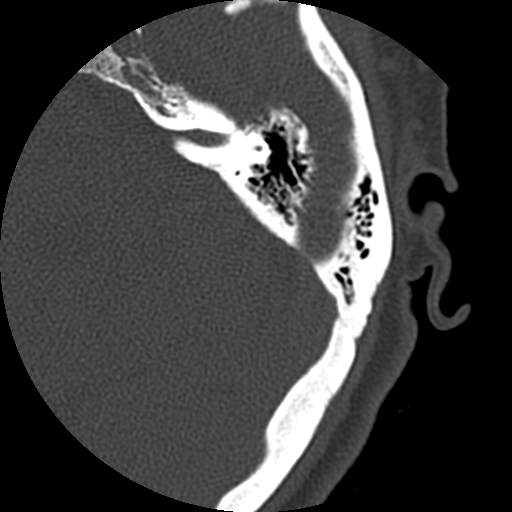

[Series 300: cor mag right · coronal · 0.20mm/px · 6 of 207 slices shown, 8 images]
[im 30/207  brain]
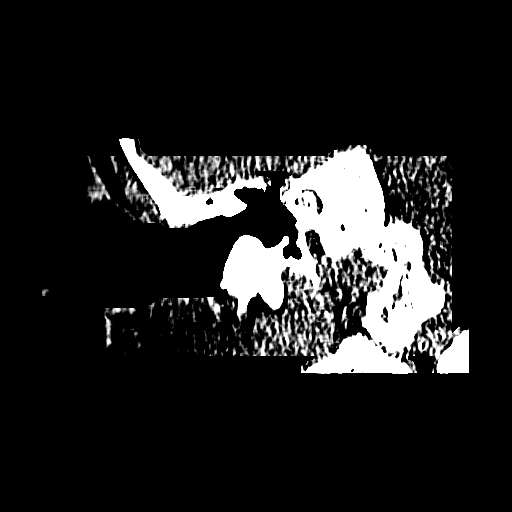
[im 30/207  bone]
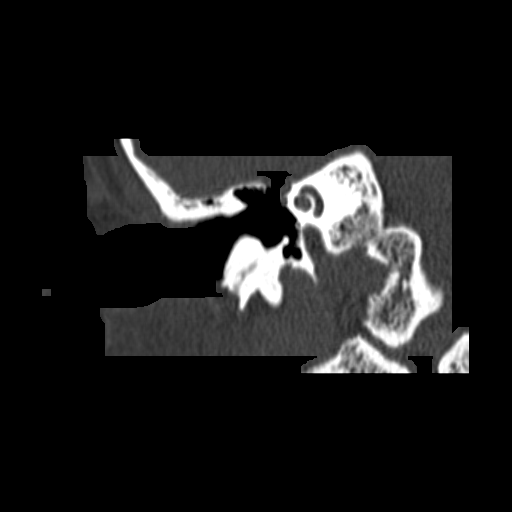
[im 59/207  bone]
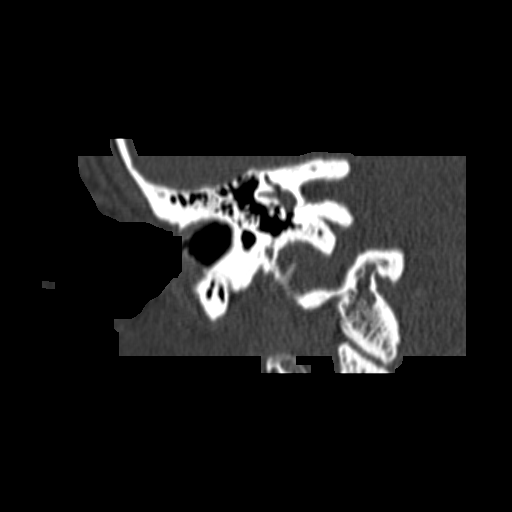
[im 89/207  bone]
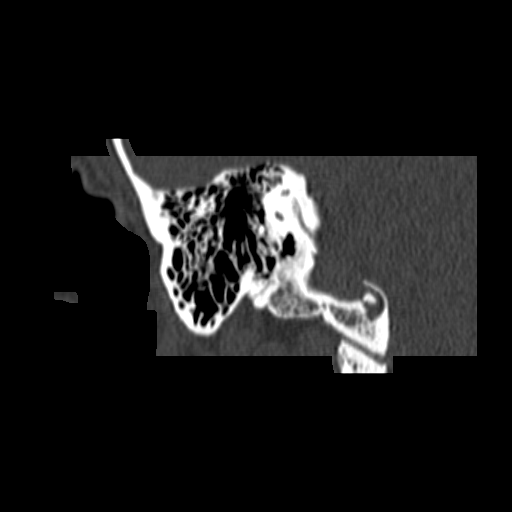
[im 118/207  bone]
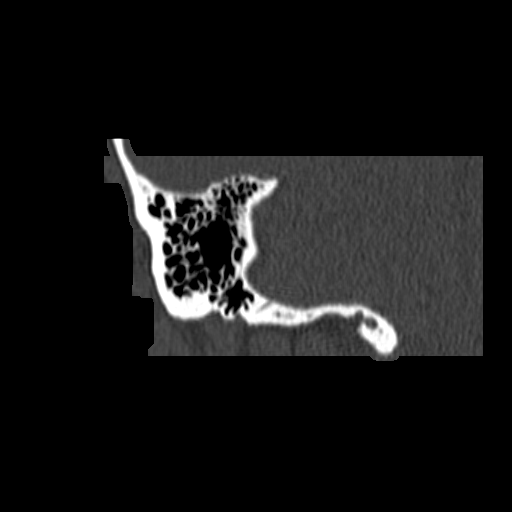
[im 148/207  brain]
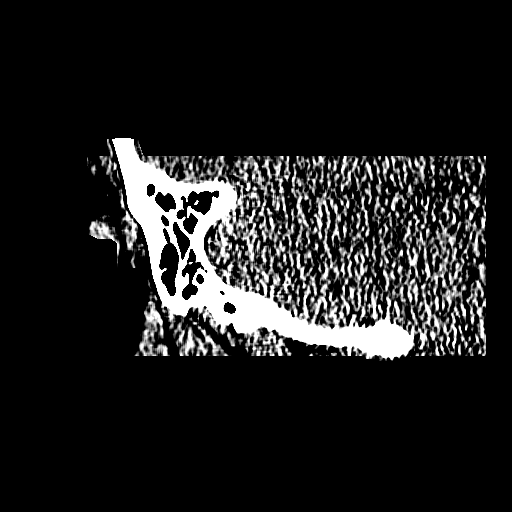
[im 148/207  bone]
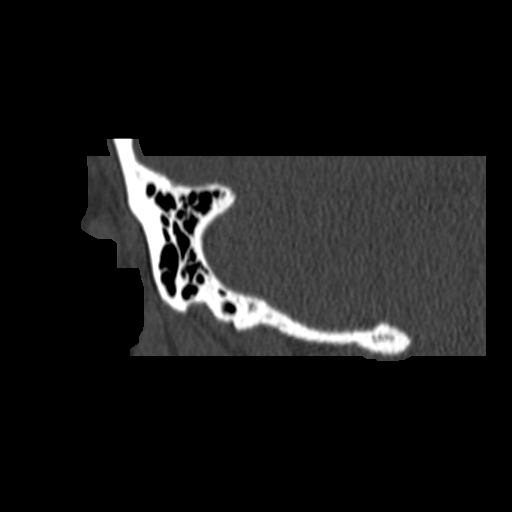
[im 177/207  bone]
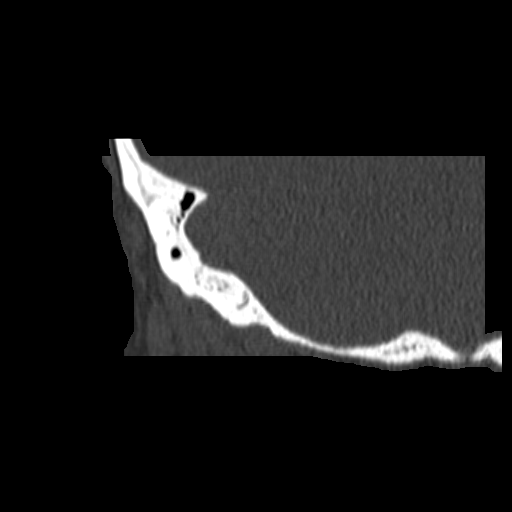

[Series 400: cor mag left · coronal · 0.20mm/px · 6 of 194 slices shown]
[im 28/194  bone]
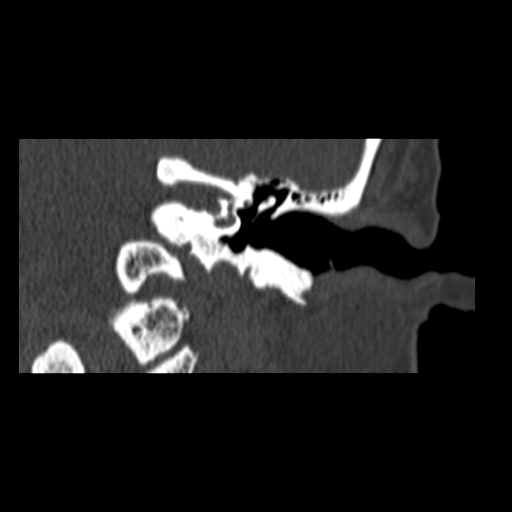
[im 56/194  bone]
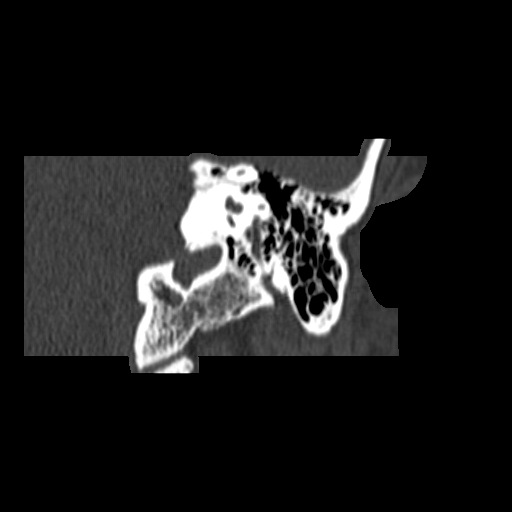
[im 83/194  bone]
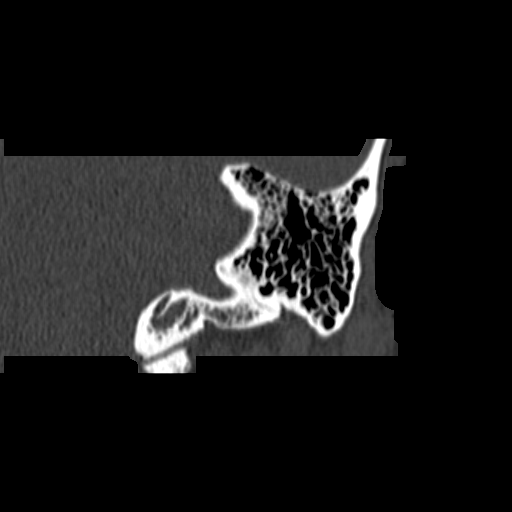
[im 111/194  bone]
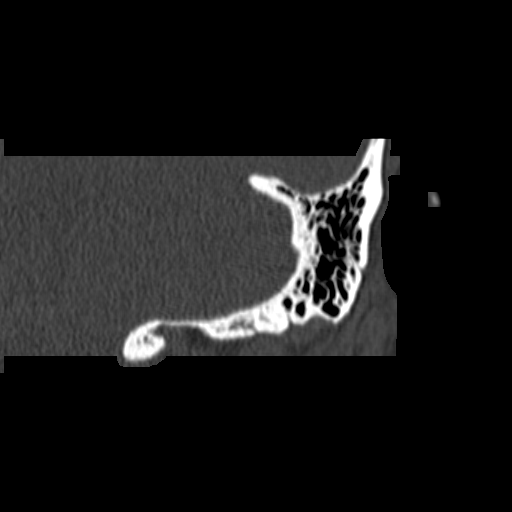
[im 138/194  bone]
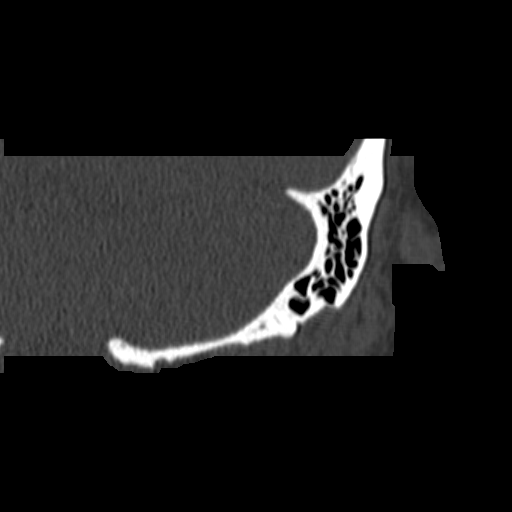
[im 166/194  bone]
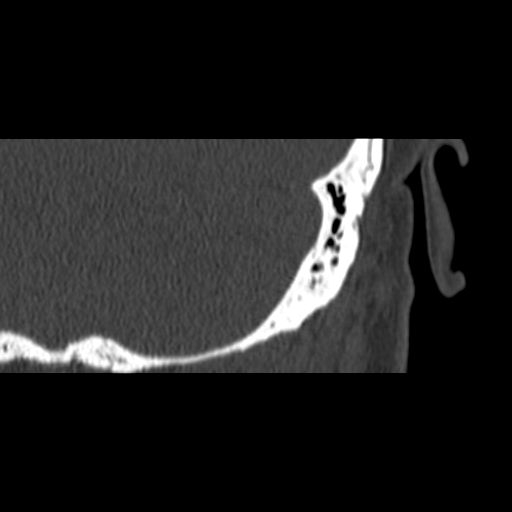

[16 of 30 positions shown; findings below may reference images not displayed]

FINDINGS: Visualized paranasal sinuses are clear. Central skull base is
normal. Limited intracranial imaging is negative.

Right temporal bone: Right mastoid sinus well developed and clear.
Middle ear is clear. Ossicles and tympanic membrane normal. Cochlea
normal. Internal auditory canal normal. Vestibular aqueduct and
semicircular canals normal. Petrous apex normal. Negative for mass
lesion. Negative for cholesteatoma.

Left temporal bone: Left mastoid sinus well developed and clear.
Left middle ear clear. Ossicles and tympanic membrane normal.
Cochlea and semicircular canals normal. Internal auditory canal
normal. Vestibular aqueduct normal in caliber. Negative for
cholesteatoma.
IMPRESSION: Normal bilateral temporal bone

## 2016-10-12 DIAGNOSIS — E7800 Pure hypercholesterolemia, unspecified: Secondary | ICD-10-CM | POA: Diagnosis not present

## 2016-10-12 DIAGNOSIS — E1165 Type 2 diabetes mellitus with hyperglycemia: Secondary | ICD-10-CM | POA: Diagnosis not present

## 2016-10-12 DIAGNOSIS — L409 Psoriasis, unspecified: Secondary | ICD-10-CM | POA: Diagnosis not present

## 2016-10-12 DIAGNOSIS — N183 Chronic kidney disease, stage 3 (moderate): Secondary | ICD-10-CM | POA: Diagnosis not present

## 2016-10-12 DIAGNOSIS — I10 Essential (primary) hypertension: Secondary | ICD-10-CM | POA: Diagnosis not present

## 2016-10-12 DIAGNOSIS — Z7984 Long term (current) use of oral hypoglycemic drugs: Secondary | ICD-10-CM | POA: Diagnosis not present

## 2016-10-12 DIAGNOSIS — L918 Other hypertrophic disorders of the skin: Secondary | ICD-10-CM | POA: Diagnosis not present

## 2016-10-12 DIAGNOSIS — E039 Hypothyroidism, unspecified: Secondary | ICD-10-CM | POA: Diagnosis not present

## 2016-10-12 DIAGNOSIS — M797 Fibromyalgia: Secondary | ICD-10-CM | POA: Diagnosis not present

## 2016-11-21 DIAGNOSIS — M5030 Other cervical disc degeneration, unspecified cervical region: Secondary | ICD-10-CM | POA: Diagnosis not present

## 2016-11-21 DIAGNOSIS — M5136 Other intervertebral disc degeneration, lumbar region: Secondary | ICD-10-CM | POA: Diagnosis not present

## 2016-11-23 DIAGNOSIS — K219 Gastro-esophageal reflux disease without esophagitis: Secondary | ICD-10-CM | POA: Diagnosis not present

## 2016-11-23 DIAGNOSIS — K2270 Barrett's esophagus without dysplasia: Secondary | ICD-10-CM | POA: Diagnosis not present

## 2016-11-28 DIAGNOSIS — L400 Psoriasis vulgaris: Secondary | ICD-10-CM | POA: Diagnosis not present

## 2016-12-06 DIAGNOSIS — Z6836 Body mass index (BMI) 36.0-36.9, adult: Secondary | ICD-10-CM | POA: Diagnosis not present

## 2016-12-06 DIAGNOSIS — Z124 Encounter for screening for malignant neoplasm of cervix: Secondary | ICD-10-CM | POA: Diagnosis not present

## 2016-12-06 DIAGNOSIS — Z1231 Encounter for screening mammogram for malignant neoplasm of breast: Secondary | ICD-10-CM | POA: Diagnosis not present

## 2016-12-06 DIAGNOSIS — N762 Acute vulvitis: Secondary | ICD-10-CM | POA: Diagnosis not present

## 2016-12-06 DIAGNOSIS — N390 Urinary tract infection, site not specified: Secondary | ICD-10-CM | POA: Diagnosis not present

## 2016-12-29 DIAGNOSIS — M797 Fibromyalgia: Secondary | ICD-10-CM | POA: Diagnosis not present

## 2016-12-29 DIAGNOSIS — M79605 Pain in left leg: Secondary | ICD-10-CM | POA: Diagnosis not present

## 2016-12-29 DIAGNOSIS — K219 Gastro-esophageal reflux disease without esophagitis: Secondary | ICD-10-CM | POA: Diagnosis not present

## 2016-12-29 DIAGNOSIS — I872 Venous insufficiency (chronic) (peripheral): Secondary | ICD-10-CM | POA: Diagnosis not present

## 2017-01-16 DIAGNOSIS — N183 Chronic kidney disease, stage 3 (moderate): Secondary | ICD-10-CM | POA: Diagnosis not present

## 2017-01-16 DIAGNOSIS — E1165 Type 2 diabetes mellitus with hyperglycemia: Secondary | ICD-10-CM | POA: Diagnosis not present

## 2017-01-16 DIAGNOSIS — E039 Hypothyroidism, unspecified: Secondary | ICD-10-CM | POA: Diagnosis not present

## 2017-01-16 DIAGNOSIS — M797 Fibromyalgia: Secondary | ICD-10-CM | POA: Diagnosis not present

## 2017-01-16 DIAGNOSIS — L409 Psoriasis, unspecified: Secondary | ICD-10-CM | POA: Diagnosis not present

## 2017-01-16 DIAGNOSIS — Z7984 Long term (current) use of oral hypoglycemic drugs: Secondary | ICD-10-CM | POA: Diagnosis not present

## 2017-01-16 DIAGNOSIS — E7800 Pure hypercholesterolemia, unspecified: Secondary | ICD-10-CM | POA: Diagnosis not present

## 2017-01-16 DIAGNOSIS — I10 Essential (primary) hypertension: Secondary | ICD-10-CM | POA: Diagnosis not present

## 2017-03-16 DIAGNOSIS — M179 Osteoarthritis of knee, unspecified: Secondary | ICD-10-CM | POA: Diagnosis not present

## 2017-03-16 DIAGNOSIS — M79643 Pain in unspecified hand: Secondary | ICD-10-CM | POA: Diagnosis not present

## 2017-03-16 DIAGNOSIS — M79672 Pain in left foot: Secondary | ICD-10-CM | POA: Diagnosis not present

## 2017-03-16 DIAGNOSIS — M19042 Primary osteoarthritis, left hand: Secondary | ICD-10-CM | POA: Diagnosis not present

## 2017-03-16 DIAGNOSIS — M19041 Primary osteoarthritis, right hand: Secondary | ICD-10-CM | POA: Diagnosis not present

## 2017-03-16 DIAGNOSIS — M25559 Pain in unspecified hip: Secondary | ICD-10-CM | POA: Diagnosis not present

## 2017-03-16 DIAGNOSIS — M2550 Pain in unspecified joint: Secondary | ICD-10-CM | POA: Diagnosis not present

## 2017-03-16 DIAGNOSIS — M25552 Pain in left hip: Secondary | ICD-10-CM | POA: Diagnosis not present

## 2017-03-16 DIAGNOSIS — M064 Inflammatory polyarthropathy: Secondary | ICD-10-CM | POA: Diagnosis not present

## 2017-03-16 DIAGNOSIS — M1612 Unilateral primary osteoarthritis, left hip: Secondary | ICD-10-CM | POA: Diagnosis not present

## 2017-03-16 DIAGNOSIS — M79671 Pain in right foot: Secondary | ICD-10-CM | POA: Diagnosis not present

## 2017-03-16 DIAGNOSIS — L409 Psoriasis, unspecified: Secondary | ICD-10-CM | POA: Diagnosis not present

## 2017-03-22 DIAGNOSIS — E669 Obesity, unspecified: Secondary | ICD-10-CM | POA: Diagnosis not present

## 2017-03-22 DIAGNOSIS — R1032 Left lower quadrant pain: Secondary | ICD-10-CM | POA: Diagnosis not present

## 2017-03-22 DIAGNOSIS — M79672 Pain in left foot: Secondary | ICD-10-CM | POA: Diagnosis not present

## 2017-03-30 DIAGNOSIS — M2550 Pain in unspecified joint: Secondary | ICD-10-CM | POA: Diagnosis not present

## 2017-03-30 DIAGNOSIS — L409 Psoriasis, unspecified: Secondary | ICD-10-CM | POA: Diagnosis not present

## 2017-03-30 DIAGNOSIS — R079 Chest pain, unspecified: Secondary | ICD-10-CM | POA: Diagnosis not present

## 2017-03-30 DIAGNOSIS — M79643 Pain in unspecified hand: Secondary | ICD-10-CM | POA: Diagnosis not present

## 2017-03-30 DIAGNOSIS — M79673 Pain in unspecified foot: Secondary | ICD-10-CM | POA: Diagnosis not present

## 2017-04-18 DIAGNOSIS — M79673 Pain in unspecified foot: Secondary | ICD-10-CM | POA: Diagnosis not present

## 2017-04-18 DIAGNOSIS — M797 Fibromyalgia: Secondary | ICD-10-CM | POA: Diagnosis not present

## 2017-04-18 DIAGNOSIS — M2550 Pain in unspecified joint: Secondary | ICD-10-CM | POA: Diagnosis not present

## 2017-04-18 DIAGNOSIS — L409 Psoriasis, unspecified: Secondary | ICD-10-CM | POA: Diagnosis not present

## 2017-04-20 DIAGNOSIS — Z23 Encounter for immunization: Secondary | ICD-10-CM | POA: Diagnosis not present

## 2017-04-21 DIAGNOSIS — M26621 Arthralgia of right temporomandibular joint: Secondary | ICD-10-CM | POA: Diagnosis not present

## 2017-04-21 DIAGNOSIS — H9202 Otalgia, left ear: Secondary | ICD-10-CM | POA: Diagnosis not present

## 2017-05-01 DIAGNOSIS — R102 Pelvic and perineal pain: Secondary | ICD-10-CM | POA: Diagnosis not present

## 2017-05-05 ENCOUNTER — Encounter: Payer: Self-pay | Admitting: Podiatry

## 2017-05-05 ENCOUNTER — Ambulatory Visit (INDEPENDENT_AMBULATORY_CARE_PROVIDER_SITE_OTHER): Payer: Medicare Other | Admitting: Podiatry

## 2017-05-05 ENCOUNTER — Other Ambulatory Visit: Payer: Self-pay | Admitting: Podiatry

## 2017-05-05 ENCOUNTER — Ambulatory Visit (INDEPENDENT_AMBULATORY_CARE_PROVIDER_SITE_OTHER): Payer: BC Managed Care – PPO

## 2017-05-05 VITALS — BP 103/65 | HR 85 | Resp 16

## 2017-05-05 DIAGNOSIS — M79671 Pain in right foot: Secondary | ICD-10-CM

## 2017-05-05 DIAGNOSIS — M79672 Pain in left foot: Principal | ICD-10-CM

## 2017-05-05 DIAGNOSIS — M779 Enthesopathy, unspecified: Secondary | ICD-10-CM

## 2017-05-05 MED ORDER — TRIAMCINOLONE ACETONIDE 10 MG/ML IJ SUSP
10.0000 mg | Freq: Once | INTRAMUSCULAR | Status: AC
  Administered 2017-05-05: 10 mg

## 2017-05-05 NOTE — Progress Notes (Signed)
Subjective:    Patient ID: Joanna Santana, female   DOB: 66 y.o.   MRN: [id number]   HPI patient states she's had a lot of pain on top of both feet and then in the left second toe she's had a long-term history of issues and has been diagnosed with psoriatic arthritis. Does not smoke and does like to be active but this is inhibiting her    Review of Systems  All other systems reviewed and are negative.       Objective:  Physical Exam  Constitutional: She appears well-developed and well-nourished.  Cardiovascular: Intact distal pulses.   Pulmonary/Chest: Effort normal.  Musculoskeletal: Normal range of motion.  Neurological: She is alert.  Skin: Skin is warm.  Nursing note and vitals reviewed.  neurovascular status intact muscle strength adequate range of motion within normal limits with patient noted to have quite a bit of discomfort on the dorsal surface and midtarsal joint bilateral with discomfort more in the interphalangeal joint proximal of the second digit left with mild discomfort in the metatarsophalangeal joint. 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 At this point dorsal tendon injections administered midtarsal joint bilateral 3 mg Kenalog 5 mg Xylocaine and I applied pad to the second digit and patient be seen back in 1 month and it still having symptoms we'll try to flared up prior to seeing me  X-rays indicate there is mild changes midtarsal joint bilateral with no obvious changes of arthritis with mild narrowness of the second digit interphalangeal joint left

## 2017-05-09 DIAGNOSIS — K2270 Barrett's esophagus without dysplasia: Secondary | ICD-10-CM | POA: Diagnosis not present

## 2017-05-09 DIAGNOSIS — R1084 Generalized abdominal pain: Secondary | ICD-10-CM | POA: Diagnosis not present

## 2017-05-09 DIAGNOSIS — R9389 Abnormal findings on diagnostic imaging of other specified body structures: Secondary | ICD-10-CM | POA: Diagnosis not present

## 2017-05-09 DIAGNOSIS — K219 Gastro-esophageal reflux disease without esophagitis: Secondary | ICD-10-CM | POA: Diagnosis not present

## 2017-05-12 DIAGNOSIS — R9389 Abnormal findings on diagnostic imaging of other specified body structures: Secondary | ICD-10-CM | POA: Diagnosis not present

## 2017-05-18 DIAGNOSIS — L400 Psoriasis vulgaris: Secondary | ICD-10-CM | POA: Diagnosis not present

## 2017-05-18 DIAGNOSIS — L821 Other seborrheic keratosis: Secondary | ICD-10-CM | POA: Diagnosis not present

## 2017-05-23 DIAGNOSIS — K2270 Barrett's esophagus without dysplasia: Secondary | ICD-10-CM | POA: Diagnosis not present

## 2017-05-23 DIAGNOSIS — R1084 Generalized abdominal pain: Secondary | ICD-10-CM | POA: Diagnosis not present

## 2017-05-23 DIAGNOSIS — N281 Cyst of kidney, acquired: Secondary | ICD-10-CM | POA: Diagnosis not present

## 2017-05-23 DIAGNOSIS — K219 Gastro-esophageal reflux disease without esophagitis: Secondary | ICD-10-CM | POA: Diagnosis not present

## 2017-05-30 DIAGNOSIS — E119 Type 2 diabetes mellitus without complications: Secondary | ICD-10-CM | POA: Diagnosis not present

## 2017-05-30 DIAGNOSIS — M1990 Unspecified osteoarthritis, unspecified site: Secondary | ICD-10-CM | POA: Diagnosis not present

## 2017-05-30 DIAGNOSIS — L4050 Arthropathic psoriasis, unspecified: Secondary | ICD-10-CM | POA: Diagnosis not present

## 2017-05-30 DIAGNOSIS — M797 Fibromyalgia: Secondary | ICD-10-CM | POA: Diagnosis not present

## 2017-05-30 DIAGNOSIS — Z79899 Other long term (current) drug therapy: Secondary | ICD-10-CM | POA: Diagnosis not present

## 2017-05-30 DIAGNOSIS — M2550 Pain in unspecified joint: Secondary | ICD-10-CM | POA: Diagnosis not present

## 2017-05-30 DIAGNOSIS — L409 Psoriasis, unspecified: Secondary | ICD-10-CM | POA: Diagnosis not present

## 2017-05-30 DIAGNOSIS — M79673 Pain in unspecified foot: Secondary | ICD-10-CM | POA: Diagnosis not present

## 2017-05-30 DIAGNOSIS — M79643 Pain in unspecified hand: Secondary | ICD-10-CM | POA: Diagnosis not present

## 2017-06-05 ENCOUNTER — Ambulatory Visit: Payer: BC Managed Care – PPO | Admitting: Podiatry

## 2017-06-05 DIAGNOSIS — R05 Cough: Secondary | ICD-10-CM | POA: Diagnosis not present

## 2017-06-05 DIAGNOSIS — T7840XA Allergy, unspecified, initial encounter: Secondary | ICD-10-CM | POA: Diagnosis not present

## 2017-06-13 DIAGNOSIS — I10 Essential (primary) hypertension: Secondary | ICD-10-CM | POA: Diagnosis not present

## 2017-06-13 DIAGNOSIS — E039 Hypothyroidism, unspecified: Secondary | ICD-10-CM | POA: Diagnosis not present

## 2017-06-13 DIAGNOSIS — N183 Chronic kidney disease, stage 3 (moderate): Secondary | ICD-10-CM | POA: Diagnosis not present

## 2017-06-13 DIAGNOSIS — K219 Gastro-esophageal reflux disease without esophagitis: Secondary | ICD-10-CM | POA: Diagnosis not present

## 2017-06-13 DIAGNOSIS — M797 Fibromyalgia: Secondary | ICD-10-CM | POA: Diagnosis not present

## 2017-06-13 DIAGNOSIS — L4050 Arthropathic psoriasis, unspecified: Secondary | ICD-10-CM | POA: Diagnosis not present

## 2017-06-13 DIAGNOSIS — E119 Type 2 diabetes mellitus without complications: Secondary | ICD-10-CM | POA: Diagnosis not present

## 2017-06-27 DIAGNOSIS — M797 Fibromyalgia: Secondary | ICD-10-CM | POA: Diagnosis not present

## 2017-06-27 DIAGNOSIS — M1990 Unspecified osteoarthritis, unspecified site: Secondary | ICD-10-CM | POA: Diagnosis not present

## 2017-06-27 DIAGNOSIS — M79673 Pain in unspecified foot: Secondary | ICD-10-CM | POA: Diagnosis not present

## 2017-06-27 DIAGNOSIS — E119 Type 2 diabetes mellitus without complications: Secondary | ICD-10-CM | POA: Diagnosis not present

## 2017-06-27 DIAGNOSIS — L4050 Arthropathic psoriasis, unspecified: Secondary | ICD-10-CM | POA: Diagnosis not present

## 2017-06-27 DIAGNOSIS — Z79899 Other long term (current) drug therapy: Secondary | ICD-10-CM | POA: Diagnosis not present

## 2017-06-27 DIAGNOSIS — L409 Psoriasis, unspecified: Secondary | ICD-10-CM | POA: Diagnosis not present

## 2017-06-27 DIAGNOSIS — M79643 Pain in unspecified hand: Secondary | ICD-10-CM | POA: Diagnosis not present

## 2017-06-27 DIAGNOSIS — M2550 Pain in unspecified joint: Secondary | ICD-10-CM | POA: Diagnosis not present

## 2017-07-03 ENCOUNTER — Encounter: Payer: Self-pay | Admitting: Podiatry

## 2017-07-03 ENCOUNTER — Ambulatory Visit (INDEPENDENT_AMBULATORY_CARE_PROVIDER_SITE_OTHER): Payer: Medicare Other | Admitting: Podiatry

## 2017-07-03 DIAGNOSIS — G629 Polyneuropathy, unspecified: Secondary | ICD-10-CM

## 2017-07-03 DIAGNOSIS — M779 Enthesopathy, unspecified: Secondary | ICD-10-CM

## 2017-07-03 MED ORDER — GABAPENTIN 300 MG PO CAPS: 300.0000 mg | ORAL_CAPSULE | Freq: Three times a day (TID) | ORAL | 3 refills | Status: AC

## 2017-07-03 NOTE — Progress Notes (Signed)
Subjective:   Patient ID: Joanna Santana, female   DOB: 66 y.o.   MRN: [id number]   HPI Patient states she still has some problems but overall doing some better and is concerned about neuropathy   ROS      Objective:  Physical Exam  Neurovascular status intact with diminishment of discomfort dorsal aspect both feet with pain still present but improved from previous with patient noted to have inflammation of the feet in general with a lot of discomfort which appears to be more nerve pain and patient states she is trying to get her A1c down and watch her weight     Assessment:  Dorsal inflammation with moderate improvement along with neuropathic pains bilateral     Plan:   H&P condition reviewed and discussed treatment options. Do not recommend further injections and did discuss topical medicines at one point in future and today I went ahead and I did place her on gabapentin with one pill to be taken in the evenings followed by one and morning and mid day as needed. Reappoint to recheck

## 2017-07-20 DIAGNOSIS — J029 Acute pharyngitis, unspecified: Secondary | ICD-10-CM | POA: Diagnosis not present

## 2017-08-03 DIAGNOSIS — H02422 Myogenic ptosis of left eyelid: Secondary | ICD-10-CM | POA: Diagnosis not present

## 2017-08-03 DIAGNOSIS — G510 Bell's palsy: Secondary | ICD-10-CM | POA: Diagnosis not present

## 2017-08-03 DIAGNOSIS — H5712 Ocular pain, left eye: Secondary | ICD-10-CM | POA: Diagnosis not present

## 2017-08-29 DIAGNOSIS — M79673 Pain in unspecified foot: Secondary | ICD-10-CM | POA: Diagnosis not present

## 2017-08-29 DIAGNOSIS — M797 Fibromyalgia: Secondary | ICD-10-CM | POA: Diagnosis not present

## 2017-08-29 DIAGNOSIS — M1990 Unspecified osteoarthritis, unspecified site: Secondary | ICD-10-CM | POA: Diagnosis not present

## 2017-08-29 DIAGNOSIS — M2550 Pain in unspecified joint: Secondary | ICD-10-CM | POA: Diagnosis not present

## 2017-08-29 DIAGNOSIS — M79643 Pain in unspecified hand: Secondary | ICD-10-CM | POA: Diagnosis not present

## 2017-08-29 DIAGNOSIS — Z79899 Other long term (current) drug therapy: Secondary | ICD-10-CM | POA: Diagnosis not present

## 2017-08-29 DIAGNOSIS — L4050 Arthropathic psoriasis, unspecified: Secondary | ICD-10-CM | POA: Diagnosis not present

## 2017-08-29 DIAGNOSIS — L409 Psoriasis, unspecified: Secondary | ICD-10-CM | POA: Diagnosis not present

## 2017-08-29 DIAGNOSIS — Z13820 Encounter for screening for osteoporosis: Secondary | ICD-10-CM | POA: Diagnosis not present

## 2017-09-13 ENCOUNTER — Ambulatory Visit: Payer: BC Managed Care – PPO | Admitting: Podiatry

## 2017-09-14 DIAGNOSIS — L300 Nummular dermatitis: Secondary | ICD-10-CM | POA: Diagnosis not present

## 2017-09-21 DIAGNOSIS — E1165 Type 2 diabetes mellitus with hyperglycemia: Secondary | ICD-10-CM | POA: Diagnosis not present

## 2017-09-21 DIAGNOSIS — M797 Fibromyalgia: Secondary | ICD-10-CM | POA: Diagnosis not present

## 2017-09-21 DIAGNOSIS — G2581 Restless legs syndrome: Secondary | ICD-10-CM | POA: Diagnosis not present

## 2017-09-21 DIAGNOSIS — N183 Chronic kidney disease, stage 3 (moderate): Secondary | ICD-10-CM | POA: Diagnosis not present

## 2017-09-21 DIAGNOSIS — E1140 Type 2 diabetes mellitus with diabetic neuropathy, unspecified: Secondary | ICD-10-CM | POA: Diagnosis not present

## 2017-09-21 DIAGNOSIS — L4050 Arthropathic psoriasis, unspecified: Secondary | ICD-10-CM | POA: Diagnosis not present

## 2017-09-21 DIAGNOSIS — E039 Hypothyroidism, unspecified: Secondary | ICD-10-CM | POA: Diagnosis not present

## 2017-09-21 DIAGNOSIS — I10 Essential (primary) hypertension: Secondary | ICD-10-CM | POA: Diagnosis not present

## 2017-09-21 DIAGNOSIS — K219 Gastro-esophageal reflux disease without esophagitis: Secondary | ICD-10-CM | POA: Diagnosis not present

## 2017-10-11 DIAGNOSIS — L4050 Arthropathic psoriasis, unspecified: Secondary | ICD-10-CM | POA: Diagnosis not present

## 2017-10-11 DIAGNOSIS — Z79899 Other long term (current) drug therapy: Secondary | ICD-10-CM | POA: Diagnosis not present

## 2017-10-11 DIAGNOSIS — Z13820 Encounter for screening for osteoporosis: Secondary | ICD-10-CM | POA: Diagnosis not present

## 2017-10-11 DIAGNOSIS — M797 Fibromyalgia: Secondary | ICD-10-CM | POA: Diagnosis not present

## 2017-10-11 DIAGNOSIS — L409 Psoriasis, unspecified: Secondary | ICD-10-CM | POA: Diagnosis not present

## 2017-10-11 DIAGNOSIS — M1990 Unspecified osteoarthritis, unspecified site: Secondary | ICD-10-CM | POA: Diagnosis not present

## 2017-10-11 DIAGNOSIS — M79643 Pain in unspecified hand: Secondary | ICD-10-CM | POA: Diagnosis not present

## 2017-10-11 DIAGNOSIS — M2550 Pain in unspecified joint: Secondary | ICD-10-CM | POA: Diagnosis not present

## 2018-05-29 DIAGNOSIS — M11229 Other chondrocalcinosis, unspecified elbow: Secondary | ICD-10-CM | POA: Insufficient documentation

## 2018-05-30 ENCOUNTER — Other Ambulatory Visit: Payer: Self-pay | Admitting: Podiatry

## 2019-10-01 DIAGNOSIS — M25511 Pain in right shoulder: Secondary | ICD-10-CM | POA: Diagnosis not present

## 2019-10-25 DIAGNOSIS — Z79899 Other long term (current) drug therapy: Secondary | ICD-10-CM | POA: Diagnosis not present

## 2019-10-25 DIAGNOSIS — M19042 Primary osteoarthritis, left hand: Secondary | ICD-10-CM | POA: Diagnosis not present

## 2019-10-25 DIAGNOSIS — L409 Psoriasis, unspecified: Secondary | ICD-10-CM | POA: Diagnosis not present

## 2019-10-25 DIAGNOSIS — M797 Fibromyalgia: Secondary | ICD-10-CM | POA: Diagnosis not present

## 2019-10-25 DIAGNOSIS — M19041 Primary osteoarthritis, right hand: Secondary | ICD-10-CM | POA: Diagnosis not present

## 2019-10-25 DIAGNOSIS — Z13820 Encounter for screening for osteoporosis: Secondary | ICD-10-CM | POA: Diagnosis not present

## 2019-10-25 DIAGNOSIS — L4050 Arthropathic psoriasis, unspecified: Secondary | ICD-10-CM | POA: Diagnosis not present

## 2019-10-25 DIAGNOSIS — R300 Dysuria: Secondary | ICD-10-CM | POA: Diagnosis not present

## 2019-10-25 DIAGNOSIS — M79642 Pain in left hand: Secondary | ICD-10-CM | POA: Diagnosis not present

## 2019-10-25 DIAGNOSIS — M1990 Unspecified osteoarthritis, unspecified site: Secondary | ICD-10-CM | POA: Diagnosis not present

## 2019-10-25 DIAGNOSIS — M79641 Pain in right hand: Secondary | ICD-10-CM | POA: Diagnosis not present

## 2019-10-25 DIAGNOSIS — M79643 Pain in unspecified hand: Secondary | ICD-10-CM | POA: Diagnosis not present

## 2019-10-25 DIAGNOSIS — M549 Dorsalgia, unspecified: Secondary | ICD-10-CM | POA: Diagnosis not present

## 2019-12-03 DIAGNOSIS — M25572 Pain in left ankle and joints of left foot: Secondary | ICD-10-CM | POA: Diagnosis not present

## 2019-12-16 DIAGNOSIS — M25572 Pain in left ankle and joints of left foot: Secondary | ICD-10-CM | POA: Diagnosis not present

## 2019-12-20 DIAGNOSIS — M25511 Pain in right shoulder: Secondary | ICD-10-CM | POA: Diagnosis not present

## 2019-12-20 DIAGNOSIS — M797 Fibromyalgia: Secondary | ICD-10-CM | POA: Diagnosis not present

## 2019-12-20 DIAGNOSIS — E119 Type 2 diabetes mellitus without complications: Secondary | ICD-10-CM | POA: Diagnosis not present

## 2019-12-20 DIAGNOSIS — M25512 Pain in left shoulder: Secondary | ICD-10-CM | POA: Diagnosis not present

## 2019-12-20 DIAGNOSIS — L4050 Arthropathic psoriasis, unspecified: Secondary | ICD-10-CM | POA: Diagnosis not present

## 2019-12-23 DIAGNOSIS — M76822 Posterior tibial tendinitis, left leg: Secondary | ICD-10-CM | POA: Diagnosis not present

## 2019-12-23 DIAGNOSIS — M76829 Posterior tibial tendinitis, unspecified leg: Secondary | ICD-10-CM | POA: Diagnosis not present

## 2019-12-23 DIAGNOSIS — M6702 Short Achilles tendon (acquired), left ankle: Secondary | ICD-10-CM | POA: Diagnosis not present

## 2020-01-02 DIAGNOSIS — M1712 Unilateral primary osteoarthritis, left knee: Secondary | ICD-10-CM | POA: Diagnosis not present

## 2020-01-02 DIAGNOSIS — M1711 Unilateral primary osteoarthritis, right knee: Secondary | ICD-10-CM | POA: Diagnosis not present

## 2020-01-02 DIAGNOSIS — M170 Bilateral primary osteoarthritis of knee: Secondary | ICD-10-CM | POA: Diagnosis not present

## 2020-01-07 DIAGNOSIS — M76822 Posterior tibial tendinitis, left leg: Secondary | ICD-10-CM | POA: Diagnosis not present

## 2020-01-13 DIAGNOSIS — M76822 Posterior tibial tendinitis, left leg: Secondary | ICD-10-CM | POA: Diagnosis not present

## 2020-01-15 DIAGNOSIS — M76822 Posterior tibial tendinitis, left leg: Secondary | ICD-10-CM | POA: Diagnosis not present

## 2020-01-29 DIAGNOSIS — M19072 Primary osteoarthritis, left ankle and foot: Secondary | ICD-10-CM | POA: Diagnosis not present

## 2020-01-29 DIAGNOSIS — M797 Fibromyalgia: Secondary | ICD-10-CM | POA: Diagnosis not present

## 2020-01-29 DIAGNOSIS — M76822 Posterior tibial tendinitis, left leg: Secondary | ICD-10-CM | POA: Diagnosis not present

## 2020-01-29 DIAGNOSIS — M19079 Primary osteoarthritis, unspecified ankle and foot: Secondary | ICD-10-CM | POA: Insufficient documentation

## 2020-01-29 DIAGNOSIS — M25572 Pain in left ankle and joints of left foot: Secondary | ICD-10-CM | POA: Diagnosis not present

## 2020-02-05 DIAGNOSIS — R300 Dysuria: Secondary | ICD-10-CM | POA: Diagnosis not present

## 2020-02-05 DIAGNOSIS — L409 Psoriasis, unspecified: Secondary | ICD-10-CM | POA: Diagnosis not present

## 2020-02-05 DIAGNOSIS — M797 Fibromyalgia: Secondary | ICD-10-CM | POA: Diagnosis not present

## 2020-02-05 DIAGNOSIS — M79643 Pain in unspecified hand: Secondary | ICD-10-CM | POA: Diagnosis not present

## 2020-02-05 DIAGNOSIS — Z79899 Other long term (current) drug therapy: Secondary | ICD-10-CM | POA: Diagnosis not present

## 2020-02-05 DIAGNOSIS — M1990 Unspecified osteoarthritis, unspecified site: Secondary | ICD-10-CM | POA: Diagnosis not present

## 2020-02-05 DIAGNOSIS — M549 Dorsalgia, unspecified: Secondary | ICD-10-CM | POA: Diagnosis not present

## 2020-02-05 DIAGNOSIS — Z13820 Encounter for screening for osteoporosis: Secondary | ICD-10-CM | POA: Diagnosis not present

## 2020-02-05 DIAGNOSIS — L4050 Arthropathic psoriasis, unspecified: Secondary | ICD-10-CM | POA: Diagnosis not present

## 2020-02-07 DIAGNOSIS — E7800 Pure hypercholesterolemia, unspecified: Secondary | ICD-10-CM | POA: Diagnosis not present

## 2020-02-07 DIAGNOSIS — E1140 Type 2 diabetes mellitus with diabetic neuropathy, unspecified: Secondary | ICD-10-CM | POA: Diagnosis not present

## 2020-02-07 DIAGNOSIS — L4050 Arthropathic psoriasis, unspecified: Secondary | ICD-10-CM | POA: Diagnosis not present

## 2020-02-07 DIAGNOSIS — I10 Essential (primary) hypertension: Secondary | ICD-10-CM | POA: Diagnosis not present

## 2020-02-07 DIAGNOSIS — Z7984 Long term (current) use of oral hypoglycemic drugs: Secondary | ICD-10-CM | POA: Diagnosis not present

## 2020-02-07 DIAGNOSIS — N183 Chronic kidney disease, stage 3 unspecified: Secondary | ICD-10-CM | POA: Diagnosis not present

## 2020-02-07 DIAGNOSIS — E039 Hypothyroidism, unspecified: Secondary | ICD-10-CM | POA: Diagnosis not present

## 2020-03-10 DIAGNOSIS — L4050 Arthropathic psoriasis, unspecified: Secondary | ICD-10-CM | POA: Diagnosis not present

## 2020-03-10 DIAGNOSIS — M1990 Unspecified osteoarthritis, unspecified site: Secondary | ICD-10-CM | POA: Diagnosis not present

## 2020-03-10 DIAGNOSIS — M25572 Pain in left ankle and joints of left foot: Secondary | ICD-10-CM | POA: Diagnosis not present

## 2020-03-10 DIAGNOSIS — M797 Fibromyalgia: Secondary | ICD-10-CM | POA: Diagnosis not present

## 2020-03-10 DIAGNOSIS — M79643 Pain in unspecified hand: Secondary | ICD-10-CM | POA: Diagnosis not present

## 2020-03-10 DIAGNOSIS — M549 Dorsalgia, unspecified: Secondary | ICD-10-CM | POA: Diagnosis not present

## 2020-03-10 DIAGNOSIS — Z13820 Encounter for screening for osteoporosis: Secondary | ICD-10-CM | POA: Diagnosis not present

## 2020-03-10 DIAGNOSIS — Z79899 Other long term (current) drug therapy: Secondary | ICD-10-CM | POA: Diagnosis not present

## 2020-03-10 DIAGNOSIS — L409 Psoriasis, unspecified: Secondary | ICD-10-CM | POA: Diagnosis not present

## 2020-03-24 DIAGNOSIS — I10 Essential (primary) hypertension: Secondary | ICD-10-CM | POA: Diagnosis not present

## 2020-03-24 DIAGNOSIS — N183 Chronic kidney disease, stage 3 unspecified: Secondary | ICD-10-CM | POA: Diagnosis not present

## 2020-03-24 DIAGNOSIS — E1140 Type 2 diabetes mellitus with diabetic neuropathy, unspecified: Secondary | ICD-10-CM | POA: Diagnosis not present

## 2020-03-24 DIAGNOSIS — Z0000 Encounter for general adult medical examination without abnormal findings: Secondary | ICD-10-CM | POA: Diagnosis not present

## 2020-03-24 DIAGNOSIS — E7800 Pure hypercholesterolemia, unspecified: Secondary | ICD-10-CM | POA: Diagnosis not present

## 2020-03-24 DIAGNOSIS — R350 Frequency of micturition: Secondary | ICD-10-CM | POA: Diagnosis not present

## 2020-03-24 DIAGNOSIS — N3000 Acute cystitis without hematuria: Secondary | ICD-10-CM | POA: Diagnosis not present

## 2020-03-24 DIAGNOSIS — L4050 Arthropathic psoriasis, unspecified: Secondary | ICD-10-CM | POA: Diagnosis not present

## 2020-03-24 DIAGNOSIS — M797 Fibromyalgia: Secondary | ICD-10-CM | POA: Diagnosis not present

## 2020-03-24 DIAGNOSIS — E039 Hypothyroidism, unspecified: Secondary | ICD-10-CM | POA: Diagnosis not present

## 2020-03-25 DIAGNOSIS — M19072 Primary osteoarthritis, left ankle and foot: Secondary | ICD-10-CM | POA: Diagnosis not present

## 2020-03-25 DIAGNOSIS — M7661 Achilles tendinitis, right leg: Secondary | ICD-10-CM | POA: Diagnosis not present

## 2020-04-02 DIAGNOSIS — M25561 Pain in right knee: Secondary | ICD-10-CM | POA: Diagnosis not present

## 2020-04-02 DIAGNOSIS — M170 Bilateral primary osteoarthritis of knee: Secondary | ICD-10-CM | POA: Diagnosis not present

## 2020-04-21 DIAGNOSIS — M7061 Trochanteric bursitis, right hip: Secondary | ICD-10-CM | POA: Diagnosis not present

## 2020-04-21 DIAGNOSIS — M25562 Pain in left knee: Secondary | ICD-10-CM | POA: Diagnosis not present

## 2020-04-21 DIAGNOSIS — M25561 Pain in right knee: Secondary | ICD-10-CM | POA: Diagnosis not present

## 2020-05-15 DIAGNOSIS — Z124 Encounter for screening for malignant neoplasm of cervix: Secondary | ICD-10-CM | POA: Diagnosis not present

## 2020-05-15 DIAGNOSIS — Z6830 Body mass index (BMI) 30.0-30.9, adult: Secondary | ICD-10-CM | POA: Diagnosis not present

## 2020-05-15 DIAGNOSIS — Z1231 Encounter for screening mammogram for malignant neoplasm of breast: Secondary | ICD-10-CM | POA: Diagnosis not present

## 2020-06-05 DIAGNOSIS — Z79899 Other long term (current) drug therapy: Secondary | ICD-10-CM | POA: Diagnosis not present

## 2020-06-05 DIAGNOSIS — L4050 Arthropathic psoriasis, unspecified: Secondary | ICD-10-CM | POA: Diagnosis not present

## 2020-06-05 DIAGNOSIS — Z23 Encounter for immunization: Secondary | ICD-10-CM | POA: Diagnosis not present

## 2020-06-05 DIAGNOSIS — M797 Fibromyalgia: Secondary | ICD-10-CM | POA: Diagnosis not present

## 2020-06-05 DIAGNOSIS — Z13820 Encounter for screening for osteoporosis: Secondary | ICD-10-CM | POA: Diagnosis not present

## 2020-06-05 DIAGNOSIS — M1990 Unspecified osteoarthritis, unspecified site: Secondary | ICD-10-CM | POA: Diagnosis not present

## 2020-06-05 DIAGNOSIS — M79643 Pain in unspecified hand: Secondary | ICD-10-CM | POA: Diagnosis not present

## 2020-06-05 DIAGNOSIS — M549 Dorsalgia, unspecified: Secondary | ICD-10-CM | POA: Diagnosis not present

## 2020-06-05 DIAGNOSIS — L409 Psoriasis, unspecified: Secondary | ICD-10-CM | POA: Diagnosis not present

## 2020-06-30 DIAGNOSIS — E1140 Type 2 diabetes mellitus with diabetic neuropathy, unspecified: Secondary | ICD-10-CM | POA: Diagnosis not present

## 2020-06-30 DIAGNOSIS — E7800 Pure hypercholesterolemia, unspecified: Secondary | ICD-10-CM | POA: Diagnosis not present

## 2020-06-30 DIAGNOSIS — Z7984 Long term (current) use of oral hypoglycemic drugs: Secondary | ICD-10-CM | POA: Diagnosis not present

## 2020-06-30 DIAGNOSIS — L4050 Arthropathic psoriasis, unspecified: Secondary | ICD-10-CM | POA: Diagnosis not present

## 2020-06-30 DIAGNOSIS — N390 Urinary tract infection, site not specified: Secondary | ICD-10-CM | POA: Diagnosis not present

## 2020-06-30 DIAGNOSIS — N183 Chronic kidney disease, stage 3 unspecified: Secondary | ICD-10-CM | POA: Diagnosis not present

## 2020-06-30 DIAGNOSIS — E039 Hypothyroidism, unspecified: Secondary | ICD-10-CM | POA: Diagnosis not present

## 2020-06-30 DIAGNOSIS — N3000 Acute cystitis without hematuria: Secondary | ICD-10-CM | POA: Diagnosis not present

## 2020-06-30 DIAGNOSIS — I10 Essential (primary) hypertension: Secondary | ICD-10-CM | POA: Diagnosis not present

## 2020-09-07 DIAGNOSIS — Z13820 Encounter for screening for osteoporosis: Secondary | ICD-10-CM | POA: Diagnosis not present

## 2020-09-07 DIAGNOSIS — L4050 Arthropathic psoriasis, unspecified: Secondary | ICD-10-CM | POA: Diagnosis not present

## 2020-09-07 DIAGNOSIS — M549 Dorsalgia, unspecified: Secondary | ICD-10-CM | POA: Diagnosis not present

## 2020-09-07 DIAGNOSIS — L409 Psoriasis, unspecified: Secondary | ICD-10-CM | POA: Diagnosis not present

## 2020-09-07 DIAGNOSIS — M797 Fibromyalgia: Secondary | ICD-10-CM | POA: Diagnosis not present

## 2020-09-07 DIAGNOSIS — M1990 Unspecified osteoarthritis, unspecified site: Secondary | ICD-10-CM | POA: Diagnosis not present

## 2020-09-07 DIAGNOSIS — Z79899 Other long term (current) drug therapy: Secondary | ICD-10-CM | POA: Diagnosis not present

## 2020-09-07 DIAGNOSIS — M2550 Pain in unspecified joint: Secondary | ICD-10-CM | POA: Diagnosis not present

## 2020-12-17 DIAGNOSIS — M549 Dorsalgia, unspecified: Secondary | ICD-10-CM | POA: Diagnosis not present

## 2020-12-17 DIAGNOSIS — M1990 Unspecified osteoarthritis, unspecified site: Secondary | ICD-10-CM | POA: Diagnosis not present

## 2020-12-17 DIAGNOSIS — L409 Psoriasis, unspecified: Secondary | ICD-10-CM | POA: Diagnosis not present

## 2020-12-17 DIAGNOSIS — Z13820 Encounter for screening for osteoporosis: Secondary | ICD-10-CM | POA: Diagnosis not present

## 2020-12-17 DIAGNOSIS — Z79899 Other long term (current) drug therapy: Secondary | ICD-10-CM | POA: Diagnosis not present

## 2020-12-17 DIAGNOSIS — M797 Fibromyalgia: Secondary | ICD-10-CM | POA: Diagnosis not present

## 2020-12-17 DIAGNOSIS — L4050 Arthropathic psoriasis, unspecified: Secondary | ICD-10-CM | POA: Diagnosis not present

## 2021-01-27 DIAGNOSIS — N183 Chronic kidney disease, stage 3 unspecified: Secondary | ICD-10-CM | POA: Diagnosis not present

## 2021-01-27 DIAGNOSIS — E1165 Type 2 diabetes mellitus with hyperglycemia: Secondary | ICD-10-CM | POA: Diagnosis not present

## 2021-01-27 DIAGNOSIS — E7800 Pure hypercholesterolemia, unspecified: Secondary | ICD-10-CM | POA: Diagnosis not present

## 2021-01-27 DIAGNOSIS — R300 Dysuria: Secondary | ICD-10-CM | POA: Diagnosis not present

## 2021-01-27 DIAGNOSIS — E1140 Type 2 diabetes mellitus with diabetic neuropathy, unspecified: Secondary | ICD-10-CM | POA: Diagnosis not present

## 2021-01-27 DIAGNOSIS — L4050 Arthropathic psoriasis, unspecified: Secondary | ICD-10-CM | POA: Diagnosis not present

## 2021-01-27 DIAGNOSIS — E039 Hypothyroidism, unspecified: Secondary | ICD-10-CM | POA: Diagnosis not present

## 2021-01-27 DIAGNOSIS — M797 Fibromyalgia: Secondary | ICD-10-CM | POA: Diagnosis not present

## 2021-01-27 DIAGNOSIS — I10 Essential (primary) hypertension: Secondary | ICD-10-CM | POA: Diagnosis not present

## 2021-03-24 DIAGNOSIS — M797 Fibromyalgia: Secondary | ICD-10-CM | POA: Diagnosis not present

## 2021-03-24 DIAGNOSIS — L409 Psoriasis, unspecified: Secondary | ICD-10-CM | POA: Diagnosis not present

## 2021-03-24 DIAGNOSIS — M549 Dorsalgia, unspecified: Secondary | ICD-10-CM | POA: Diagnosis not present

## 2021-03-24 DIAGNOSIS — M1990 Unspecified osteoarthritis, unspecified site: Secondary | ICD-10-CM | POA: Diagnosis not present

## 2021-03-24 DIAGNOSIS — Z13820 Encounter for screening for osteoporosis: Secondary | ICD-10-CM | POA: Diagnosis not present

## 2021-03-24 DIAGNOSIS — Z79899 Other long term (current) drug therapy: Secondary | ICD-10-CM | POA: Diagnosis not present

## 2021-03-24 DIAGNOSIS — M79643 Pain in unspecified hand: Secondary | ICD-10-CM | POA: Diagnosis not present

## 2021-03-24 DIAGNOSIS — M25562 Pain in left knee: Secondary | ICD-10-CM | POA: Diagnosis not present

## 2021-03-24 DIAGNOSIS — L4050 Arthropathic psoriasis, unspecified: Secondary | ICD-10-CM | POA: Diagnosis not present

## 2021-04-15 DIAGNOSIS — N3001 Acute cystitis with hematuria: Secondary | ICD-10-CM | POA: Diagnosis not present

## 2021-04-15 DIAGNOSIS — R300 Dysuria: Secondary | ICD-10-CM | POA: Diagnosis not present

## 2021-06-22 DIAGNOSIS — N3000 Acute cystitis without hematuria: Secondary | ICD-10-CM | POA: Diagnosis not present

## 2021-06-22 DIAGNOSIS — R300 Dysuria: Secondary | ICD-10-CM | POA: Diagnosis not present

## 2021-06-22 DIAGNOSIS — Z87440 Personal history of urinary (tract) infections: Secondary | ICD-10-CM | POA: Diagnosis not present

## 2021-06-28 DIAGNOSIS — H1030 Unspecified acute conjunctivitis, unspecified eye: Secondary | ICD-10-CM | POA: Diagnosis not present

## 2021-07-05 DIAGNOSIS — M25562 Pain in left knee: Secondary | ICD-10-CM | POA: Diagnosis not present

## 2021-07-05 DIAGNOSIS — L4050 Arthropathic psoriasis, unspecified: Secondary | ICD-10-CM | POA: Diagnosis not present

## 2021-07-05 DIAGNOSIS — M1990 Unspecified osteoarthritis, unspecified site: Secondary | ICD-10-CM | POA: Diagnosis not present

## 2021-07-05 DIAGNOSIS — M549 Dorsalgia, unspecified: Secondary | ICD-10-CM | POA: Diagnosis not present

## 2021-07-05 DIAGNOSIS — M79643 Pain in unspecified hand: Secondary | ICD-10-CM | POA: Diagnosis not present

## 2021-07-05 DIAGNOSIS — Z13820 Encounter for screening for osteoporosis: Secondary | ICD-10-CM | POA: Diagnosis not present

## 2021-07-05 DIAGNOSIS — M797 Fibromyalgia: Secondary | ICD-10-CM | POA: Diagnosis not present

## 2021-07-05 DIAGNOSIS — Z79899 Other long term (current) drug therapy: Secondary | ICD-10-CM | POA: Diagnosis not present

## 2021-07-05 DIAGNOSIS — L409 Psoriasis, unspecified: Secondary | ICD-10-CM | POA: Diagnosis not present

## 2021-07-09 DIAGNOSIS — N3000 Acute cystitis without hematuria: Secondary | ICD-10-CM | POA: Diagnosis not present

## 2021-07-09 DIAGNOSIS — Z87440 Personal history of urinary (tract) infections: Secondary | ICD-10-CM | POA: Diagnosis not present

## 2021-07-19 DIAGNOSIS — U071 COVID-19: Secondary | ICD-10-CM | POA: Diagnosis not present

## 2021-07-30 DIAGNOSIS — E7800 Pure hypercholesterolemia, unspecified: Secondary | ICD-10-CM | POA: Diagnosis not present

## 2021-07-30 DIAGNOSIS — I10 Essential (primary) hypertension: Secondary | ICD-10-CM | POA: Diagnosis not present

## 2021-07-30 DIAGNOSIS — R5383 Other fatigue: Secondary | ICD-10-CM | POA: Diagnosis not present

## 2021-07-30 DIAGNOSIS — L4050 Arthropathic psoriasis, unspecified: Secondary | ICD-10-CM | POA: Diagnosis not present

## 2021-07-30 DIAGNOSIS — E039 Hypothyroidism, unspecified: Secondary | ICD-10-CM | POA: Diagnosis not present

## 2021-07-30 DIAGNOSIS — E119 Type 2 diabetes mellitus without complications: Secondary | ICD-10-CM | POA: Diagnosis not present

## 2021-08-13 DIAGNOSIS — E039 Hypothyroidism, unspecified: Secondary | ICD-10-CM | POA: Diagnosis not present

## 2021-08-13 DIAGNOSIS — I10 Essential (primary) hypertension: Secondary | ICD-10-CM | POA: Diagnosis not present

## 2021-08-13 DIAGNOSIS — H01004 Unspecified blepharitis left upper eyelid: Secondary | ICD-10-CM | POA: Diagnosis not present

## 2021-08-13 DIAGNOSIS — H01001 Unspecified blepharitis right upper eyelid: Secondary | ICD-10-CM | POA: Diagnosis not present

## 2021-08-13 DIAGNOSIS — L4050 Arthropathic psoriasis, unspecified: Secondary | ICD-10-CM | POA: Diagnosis not present

## 2021-08-13 DIAGNOSIS — E119 Type 2 diabetes mellitus without complications: Secondary | ICD-10-CM | POA: Diagnosis not present

## 2021-08-13 DIAGNOSIS — E7800 Pure hypercholesterolemia, unspecified: Secondary | ICD-10-CM | POA: Diagnosis not present

## 2021-08-13 DIAGNOSIS — N390 Urinary tract infection, site not specified: Secondary | ICD-10-CM | POA: Diagnosis not present

## 2021-08-20 DIAGNOSIS — M1990 Unspecified osteoarthritis, unspecified site: Secondary | ICD-10-CM | POA: Diagnosis not present

## 2021-08-20 DIAGNOSIS — M25532 Pain in left wrist: Secondary | ICD-10-CM | POA: Diagnosis not present

## 2021-08-20 DIAGNOSIS — M549 Dorsalgia, unspecified: Secondary | ICD-10-CM | POA: Diagnosis not present

## 2021-08-20 DIAGNOSIS — L4050 Arthropathic psoriasis, unspecified: Secondary | ICD-10-CM | POA: Diagnosis not present

## 2021-08-20 DIAGNOSIS — M797 Fibromyalgia: Secondary | ICD-10-CM | POA: Diagnosis not present

## 2021-08-20 DIAGNOSIS — L409 Psoriasis, unspecified: Secondary | ICD-10-CM | POA: Diagnosis not present

## 2021-08-20 DIAGNOSIS — Z79899 Other long term (current) drug therapy: Secondary | ICD-10-CM | POA: Diagnosis not present

## 2021-08-20 DIAGNOSIS — M79643 Pain in unspecified hand: Secondary | ICD-10-CM | POA: Diagnosis not present

## 2021-08-20 DIAGNOSIS — Z13820 Encounter for screening for osteoporosis: Secondary | ICD-10-CM | POA: Diagnosis not present

## 2021-09-08 DIAGNOSIS — M545 Low back pain, unspecified: Secondary | ICD-10-CM | POA: Diagnosis not present

## 2021-09-08 DIAGNOSIS — M7062 Trochanteric bursitis, left hip: Secondary | ICD-10-CM | POA: Insufficient documentation

## 2021-09-08 DIAGNOSIS — M25552 Pain in left hip: Secondary | ICD-10-CM | POA: Diagnosis not present

## 2021-10-04 DIAGNOSIS — Z6831 Body mass index (BMI) 31.0-31.9, adult: Secondary | ICD-10-CM | POA: Diagnosis not present

## 2021-10-04 DIAGNOSIS — Z1231 Encounter for screening mammogram for malignant neoplasm of breast: Secondary | ICD-10-CM | POA: Diagnosis not present

## 2021-10-04 DIAGNOSIS — Z124 Encounter for screening for malignant neoplasm of cervix: Secondary | ICD-10-CM | POA: Diagnosis not present

## 2021-10-11 DIAGNOSIS — N958 Other specified menopausal and perimenopausal disorders: Secondary | ICD-10-CM | POA: Diagnosis not present

## 2021-10-11 DIAGNOSIS — E039 Hypothyroidism, unspecified: Secondary | ICD-10-CM | POA: Diagnosis not present

## 2021-10-11 DIAGNOSIS — Z8262 Family history of osteoporosis: Secondary | ICD-10-CM | POA: Diagnosis not present

## 2021-10-13 DIAGNOSIS — Z8262 Family history of osteoporosis: Secondary | ICD-10-CM | POA: Diagnosis not present

## 2021-10-21 DIAGNOSIS — E119 Type 2 diabetes mellitus without complications: Secondary | ICD-10-CM | POA: Diagnosis not present

## 2021-10-21 DIAGNOSIS — Z6832 Body mass index (BMI) 32.0-32.9, adult: Secondary | ICD-10-CM | POA: Diagnosis not present

## 2021-10-21 DIAGNOSIS — L4050 Arthropathic psoriasis, unspecified: Secondary | ICD-10-CM | POA: Diagnosis not present

## 2021-10-21 DIAGNOSIS — Z79899 Other long term (current) drug therapy: Secondary | ICD-10-CM | POA: Diagnosis not present

## 2021-10-21 DIAGNOSIS — R110 Nausea: Secondary | ICD-10-CM | POA: Diagnosis not present

## 2021-10-21 DIAGNOSIS — K2270 Barrett's esophagus without dysplasia: Secondary | ICD-10-CM | POA: Diagnosis not present

## 2021-10-22 DIAGNOSIS — M797 Fibromyalgia: Secondary | ICD-10-CM | POA: Diagnosis not present

## 2021-10-22 DIAGNOSIS — M1990 Unspecified osteoarthritis, unspecified site: Secondary | ICD-10-CM | POA: Diagnosis not present

## 2021-10-22 DIAGNOSIS — Z79899 Other long term (current) drug therapy: Secondary | ICD-10-CM | POA: Diagnosis not present

## 2021-10-22 DIAGNOSIS — L4050 Arthropathic psoriasis, unspecified: Secondary | ICD-10-CM | POA: Diagnosis not present

## 2021-10-22 DIAGNOSIS — M25552 Pain in left hip: Secondary | ICD-10-CM | POA: Diagnosis not present

## 2021-10-22 DIAGNOSIS — L409 Psoriasis, unspecified: Secondary | ICD-10-CM | POA: Diagnosis not present

## 2021-10-22 DIAGNOSIS — M549 Dorsalgia, unspecified: Secondary | ICD-10-CM | POA: Diagnosis not present

## 2021-10-22 DIAGNOSIS — M79643 Pain in unspecified hand: Secondary | ICD-10-CM | POA: Diagnosis not present

## 2021-10-22 DIAGNOSIS — Z13820 Encounter for screening for osteoporosis: Secondary | ICD-10-CM | POA: Diagnosis not present

## 2021-11-02 DIAGNOSIS — E119 Type 2 diabetes mellitus without complications: Secondary | ICD-10-CM | POA: Diagnosis not present

## 2021-11-02 DIAGNOSIS — Z1211 Encounter for screening for malignant neoplasm of colon: Secondary | ICD-10-CM | POA: Diagnosis not present

## 2021-11-02 DIAGNOSIS — K2270 Barrett's esophagus without dysplasia: Secondary | ICD-10-CM | POA: Diagnosis not present

## 2021-11-04 DIAGNOSIS — R110 Nausea: Secondary | ICD-10-CM | POA: Diagnosis not present

## 2021-11-09 DIAGNOSIS — D485 Neoplasm of uncertain behavior of skin: Secondary | ICD-10-CM | POA: Diagnosis not present

## 2021-11-09 DIAGNOSIS — L400 Psoriasis vulgaris: Secondary | ICD-10-CM | POA: Diagnosis not present

## 2021-11-09 DIAGNOSIS — L570 Actinic keratosis: Secondary | ICD-10-CM | POA: Diagnosis not present

## 2021-11-09 DIAGNOSIS — D0439 Carcinoma in situ of skin of other parts of face: Secondary | ICD-10-CM | POA: Diagnosis not present

## 2021-11-09 DIAGNOSIS — L738 Other specified follicular disorders: Secondary | ICD-10-CM | POA: Diagnosis not present

## 2021-11-09 DIAGNOSIS — I788 Other diseases of capillaries: Secondary | ICD-10-CM | POA: Diagnosis not present

## 2021-11-15 DIAGNOSIS — K2270 Barrett's esophagus without dysplasia: Secondary | ICD-10-CM | POA: Diagnosis not present

## 2021-11-16 DIAGNOSIS — E7800 Pure hypercholesterolemia, unspecified: Secondary | ICD-10-CM | POA: Diagnosis not present

## 2021-11-16 DIAGNOSIS — E119 Type 2 diabetes mellitus without complications: Secondary | ICD-10-CM | POA: Diagnosis not present

## 2021-11-16 DIAGNOSIS — E039 Hypothyroidism, unspecified: Secondary | ICD-10-CM | POA: Diagnosis not present

## 2021-11-16 DIAGNOSIS — M25562 Pain in left knee: Secondary | ICD-10-CM | POA: Diagnosis not present

## 2021-11-16 DIAGNOSIS — M25552 Pain in left hip: Secondary | ICD-10-CM | POA: Diagnosis not present

## 2021-11-16 DIAGNOSIS — L4050 Arthropathic psoriasis, unspecified: Secondary | ICD-10-CM | POA: Diagnosis not present

## 2021-11-16 DIAGNOSIS — M545 Low back pain, unspecified: Secondary | ICD-10-CM | POA: Diagnosis not present

## 2021-11-16 DIAGNOSIS — N390 Urinary tract infection, site not specified: Secondary | ICD-10-CM | POA: Diagnosis not present

## 2021-11-16 DIAGNOSIS — I10 Essential (primary) hypertension: Secondary | ICD-10-CM | POA: Diagnosis not present

## 2021-11-24 DIAGNOSIS — M25552 Pain in left hip: Secondary | ICD-10-CM | POA: Diagnosis not present

## 2021-11-24 DIAGNOSIS — M25562 Pain in left knee: Secondary | ICD-10-CM | POA: Diagnosis not present

## 2021-11-24 DIAGNOSIS — M7062 Trochanteric bursitis, left hip: Secondary | ICD-10-CM | POA: Diagnosis not present

## 2021-12-06 DIAGNOSIS — K2970 Gastritis, unspecified, without bleeding: Secondary | ICD-10-CM | POA: Diagnosis not present

## 2021-12-06 DIAGNOSIS — K760 Fatty (change of) liver, not elsewhere classified: Secondary | ICD-10-CM | POA: Diagnosis not present

## 2021-12-06 DIAGNOSIS — Z6832 Body mass index (BMI) 32.0-32.9, adult: Secondary | ICD-10-CM | POA: Diagnosis not present

## 2021-12-06 DIAGNOSIS — K449 Diaphragmatic hernia without obstruction or gangrene: Secondary | ICD-10-CM | POA: Diagnosis not present

## 2021-12-06 DIAGNOSIS — K2270 Barrett's esophagus without dysplasia: Secondary | ICD-10-CM | POA: Diagnosis not present

## 2022-01-04 DIAGNOSIS — K760 Fatty (change of) liver, not elsewhere classified: Secondary | ICD-10-CM | POA: Diagnosis not present

## 2022-01-09 DIAGNOSIS — M25562 Pain in left knee: Secondary | ICD-10-CM | POA: Diagnosis not present

## 2022-01-12 DIAGNOSIS — S83232D Complex tear of medial meniscus, current injury, left knee, subsequent encounter: Secondary | ICD-10-CM | POA: Diagnosis not present

## 2022-01-12 DIAGNOSIS — M7062 Trochanteric bursitis, left hip: Secondary | ICD-10-CM | POA: Diagnosis not present

## 2022-01-14 DIAGNOSIS — L4050 Arthropathic psoriasis, unspecified: Secondary | ICD-10-CM | POA: Diagnosis not present

## 2022-01-14 DIAGNOSIS — Z13820 Encounter for screening for osteoporosis: Secondary | ICD-10-CM | POA: Diagnosis not present

## 2022-01-14 DIAGNOSIS — M797 Fibromyalgia: Secondary | ICD-10-CM | POA: Diagnosis not present

## 2022-01-14 DIAGNOSIS — M79643 Pain in unspecified hand: Secondary | ICD-10-CM | POA: Diagnosis not present

## 2022-01-14 DIAGNOSIS — M549 Dorsalgia, unspecified: Secondary | ICD-10-CM | POA: Diagnosis not present

## 2022-01-14 DIAGNOSIS — L409 Psoriasis, unspecified: Secondary | ICD-10-CM | POA: Diagnosis not present

## 2022-01-14 DIAGNOSIS — Z79899 Other long term (current) drug therapy: Secondary | ICD-10-CM | POA: Diagnosis not present

## 2022-01-14 DIAGNOSIS — M1990 Unspecified osteoarthritis, unspecified site: Secondary | ICD-10-CM | POA: Diagnosis not present

## 2022-01-15 DIAGNOSIS — S83249A Other tear of medial meniscus, current injury, unspecified knee, initial encounter: Secondary | ICD-10-CM | POA: Insufficient documentation

## 2022-02-17 DIAGNOSIS — L4050 Arthropathic psoriasis, unspecified: Secondary | ICD-10-CM | POA: Diagnosis not present

## 2022-02-17 DIAGNOSIS — N390 Urinary tract infection, site not specified: Secondary | ICD-10-CM | POA: Diagnosis not present

## 2022-02-17 DIAGNOSIS — K219 Gastro-esophageal reflux disease without esophagitis: Secondary | ICD-10-CM | POA: Diagnosis not present

## 2022-02-17 DIAGNOSIS — E7800 Pure hypercholesterolemia, unspecified: Secondary | ICD-10-CM | POA: Diagnosis not present

## 2022-02-17 DIAGNOSIS — N183 Chronic kidney disease, stage 3 unspecified: Secondary | ICD-10-CM | POA: Diagnosis not present

## 2022-02-17 DIAGNOSIS — I10 Essential (primary) hypertension: Secondary | ICD-10-CM | POA: Diagnosis not present

## 2022-02-17 DIAGNOSIS — E039 Hypothyroidism, unspecified: Secondary | ICD-10-CM | POA: Diagnosis not present

## 2022-02-17 DIAGNOSIS — E119 Type 2 diabetes mellitus without complications: Secondary | ICD-10-CM | POA: Diagnosis not present

## 2022-04-25 DIAGNOSIS — L820 Inflamed seborrheic keratosis: Secondary | ICD-10-CM | POA: Diagnosis not present

## 2022-04-25 DIAGNOSIS — D485 Neoplasm of uncertain behavior of skin: Secondary | ICD-10-CM | POA: Diagnosis not present

## 2022-04-25 DIAGNOSIS — B079 Viral wart, unspecified: Secondary | ICD-10-CM | POA: Diagnosis not present

## 2022-05-09 DIAGNOSIS — M7702 Medial epicondylitis, left elbow: Secondary | ICD-10-CM | POA: Diagnosis not present

## 2022-05-09 DIAGNOSIS — M25522 Pain in left elbow: Secondary | ICD-10-CM | POA: Diagnosis not present

## 2022-05-17 DIAGNOSIS — M25532 Pain in left wrist: Secondary | ICD-10-CM | POA: Diagnosis not present

## 2022-05-26 DIAGNOSIS — I10 Essential (primary) hypertension: Secondary | ICD-10-CM | POA: Diagnosis not present

## 2022-05-26 DIAGNOSIS — E039 Hypothyroidism, unspecified: Secondary | ICD-10-CM | POA: Diagnosis not present

## 2022-05-26 DIAGNOSIS — N390 Urinary tract infection, site not specified: Secondary | ICD-10-CM | POA: Diagnosis not present

## 2022-05-26 DIAGNOSIS — N183 Chronic kidney disease, stage 3 unspecified: Secondary | ICD-10-CM | POA: Diagnosis not present

## 2022-05-26 DIAGNOSIS — E119 Type 2 diabetes mellitus without complications: Secondary | ICD-10-CM | POA: Diagnosis not present

## 2022-05-26 DIAGNOSIS — L4050 Arthropathic psoriasis, unspecified: Secondary | ICD-10-CM | POA: Diagnosis not present

## 2022-05-26 DIAGNOSIS — E7800 Pure hypercholesterolemia, unspecified: Secondary | ICD-10-CM | POA: Diagnosis not present

## 2022-05-26 DIAGNOSIS — K219 Gastro-esophageal reflux disease without esophagitis: Secondary | ICD-10-CM | POA: Diagnosis not present

## 2022-06-08 DIAGNOSIS — M797 Fibromyalgia: Secondary | ICD-10-CM | POA: Diagnosis not present

## 2022-06-08 DIAGNOSIS — M549 Dorsalgia, unspecified: Secondary | ICD-10-CM | POA: Diagnosis not present

## 2022-06-08 DIAGNOSIS — Z13820 Encounter for screening for osteoporosis: Secondary | ICD-10-CM | POA: Diagnosis not present

## 2022-06-08 DIAGNOSIS — L4050 Arthropathic psoriasis, unspecified: Secondary | ICD-10-CM | POA: Diagnosis not present

## 2022-06-08 DIAGNOSIS — L409 Psoriasis, unspecified: Secondary | ICD-10-CM | POA: Diagnosis not present

## 2022-06-08 DIAGNOSIS — Z79899 Other long term (current) drug therapy: Secondary | ICD-10-CM | POA: Diagnosis not present

## 2022-06-08 DIAGNOSIS — M79643 Pain in unspecified hand: Secondary | ICD-10-CM | POA: Diagnosis not present

## 2022-06-08 DIAGNOSIS — M1990 Unspecified osteoarthritis, unspecified site: Secondary | ICD-10-CM | POA: Diagnosis not present

## 2022-07-01 ENCOUNTER — Ambulatory Visit: Payer: Medicare PPO | Admitting: Podiatry

## 2022-07-01 DIAGNOSIS — E119 Type 2 diabetes mellitus without complications: Secondary | ICD-10-CM | POA: Diagnosis not present

## 2022-07-01 DIAGNOSIS — Z794 Long term (current) use of insulin: Secondary | ICD-10-CM

## 2022-07-01 DIAGNOSIS — M792 Neuralgia and neuritis, unspecified: Secondary | ICD-10-CM

## 2022-07-01 NOTE — Progress Notes (Signed)
Subjective:  Patient ID: Joanna Santana, female    DOB: [birth year],  MRN: [id number]  Chief Complaint  Patient presents with   Nail Problem    Nail tenderness     71 y.o. female presents with the above complaint.  Patient presents with right great toe burning sensation at the distal tip.  Patient states been present for the past 6 weeks.  She wanted to get it evaluated is constantly there is nothing is helped shoe gear modification has not decreased it.  She denies seeing anyone else prior to seeing me.  Denies any other acute complaint would like to make sure that there is nothing concerning going on.  She is a diabetic with last A1c of 7%.   Review of Systems: Negative except as noted in the HPI. Denies N/V/F/Ch.  Past Medical History:  Diagnosis Date   Allergy    Anxiety    Arthritis    Barrett's esophagus    BBB (bundle branch block)    right   Bell's palsy    Depression    Diabetes mellitus    Fatty liver    Fibromyalgia    Gastric ulcer    GERD (gastroesophageal reflux disease)    Heart murmur    Hypertension    Thyroid disease     Current Outpatient Medications:    albuterol (PROAIR HFA) 108 (90 Base) MCG/ACT inhaler, , Disp: , Rfl:    allopurinol (ZYLOPRIM) 100 MG tablet, Take 1 tablet by mouth 2 (two) times daily., Disp: , Rfl:    aspirin EC (EQ ASPIRIN ADULT LOW DOSE) 81 MG tablet, Take by mouth., Disp: , Rfl:    atorvastatin (LIPITOR) 40 MG tablet, Take 1 tablet by mouth daily., Disp: , Rfl:    azithromycin (ZITHROMAX) 250 MG tablet, Take 1 tablet (250 mg total) by mouth daily. Take 2 pills today and then 1 pill daily until gone, Disp: 6 each, Rfl: 0   folic acid (FOLVITE) 1 MG tablet, Take 1 tablet by mouth daily., Disp: , Rfl:    gabapentin (NEURONTIN) 300 MG capsule, Take 1 capsule (300 mg total) by mouth 3 (three) times daily., Disp: 90 capsule, Rfl: 3   hydrochlorothiazide (HYDRODIURIL) 25 MG tablet, Take 1 tablet by mouth every morning., Disp: , Rfl:     levalbuterol (XOPENEX HFA) 45 MCG/ACT inhaler, Inhale into the lungs every 4 (four) hours as needed for wheezing., Disp: , Rfl:    levothyroxine (SYNTHROID, LEVOTHROID) 75 MCG tablet, Take 75 mcg by mouth daily before breakfast., Disp: , Rfl:    LORazepam (ATIVAN) 0.5 MG tablet, Take by mouth., Disp: , Rfl:    Melatonin 10 MG CAPS, Take by mouth., Disp: , Rfl:    metFORMIN (GLUCOPHAGE) 500 MG tablet, Take 1,000 mg by mouth 2 (two) times daily with a meal. For high blood sugar, Disp: , Rfl:    methotrexate (RHEUMATREX) 2.5 MG tablet, TAKE 7 TABLETS BY MOUTH ONCE A WEEK, Disp: , Rfl:    nitrofurantoin, macrocrystal-monohydrate, (MACROBID) 100 MG capsule, Take 1 capsule by mouth 2 (two) times daily., Disp: , Rfl:    olmesartan-hydrochlorothiazide (BENICAR HCT) 40-12.5 MG tablet, Take 1 tablet by mouth daily., Disp: , Rfl:    OMEPRAZOLE PO, Take 40 mg by mouth daily. , Disp: , Rfl:    ranitidine (ZANTAC) 300 MG capsule, Take 300 mg by mouth every evening., Disp: , Rfl:    sulfamethoxazole-trimethoprim (BACTRIM DS) 800-160 MG tablet, Take 1 tablet by mouth 2 (two) times daily.,  Disp: , Rfl:    TIZANIDINE HCL PO, Take 2 mg by mouth daily. , Disp: , Rfl:    traMADol (ULTRAM) 50 MG tablet, Take 50-100 mg by mouth 3 (three) times daily as needed. For sleep. Maximum dose= 8 tablets per day , Disp: , Rfl:    triamcinolone (NASACORT AQ) 55 MCG/ACT AERO nasal inhaler, Place 2 sprays into the nose daily. (Patient not taking: Reported on 07/03/2017), Disp: 1 Inhaler, Rfl: 1   trimethoprim-polymyxin b (POLYTRIM) ophthalmic solution, INSTILL 1 DROP INTO AFFECTED EYE EVERY 3 HOURS WHILE AWAKE FOR 7 DAYS, Disp: , Rfl:    valsartan-hydrochlorothiazide (DIOVAN-HCT) 160-25 MG per tablet, Take 1 tablet by mouth daily., Disp: , Rfl:   Social History   Tobacco Use  Smoking Status Former   Types: Cigarettes   Quit date: 06/04/1994   Years since quitting: 28.0  Smokeless Tobacco Never    Allergies  Allergen  Reactions   Epinephrine Other (See Comments)   Amoxicillin Rash   Codeine Anxiety and Rash   Ibuprofen Rash   Penicillins Cross Reactors Rash   Procaine Hcl Palpitations   Objective:  There were no vitals filed for this visit. There is no height or weight on file to calculate BMI. Constitutional Well developed. Well nourished.  Vascular Dorsalis pedis pulses palpable bilaterally. Posterior tibial pulses palpable bilaterally. Capillary refill normal to all digits.  No cyanosis or clubbing noted. Pedal hair growth normal.  Neurologic Normal speech. Oriented to person, place, and time. Epicritic sensation to light touch grossly present bilaterally.  Dermatologic Burning sensation noted to the distal tip of the toe.  No achiness noted.  No tenderness with active pressure to the nail.  Nail is well adhered to the underlying nailbed.  No signs of infection noted no signs of gangrene noted.  Orthopedic: Normal joint ROM without pain or crepitus bilaterally. No visible deformities. No bony tenderness.   Radiographs: None Assessment:   1. Neuritis   2. Controlled type 2 diabetes mellitus without complication, with long-term current use of insulin (HCC)    Plan:  Patient was evaluated and treated and all questions answered.  Right hallux great toe neuritis -All questions and concerns were discussed with the patient in extensive detail.  I discussed some over-the-counter therapies such as Vicks VapoRub to help distract her mind from the burning sensation.  This may likely be attributed from diabetes.  She may be experiencing some neuropathic pain.  At this time I am not concerned for any infection or any gangrenous changes to the toe.  She has active palpable pulses.  No follow-ups on file.

## 2022-07-07 DIAGNOSIS — N3020 Other chronic cystitis without hematuria: Secondary | ICD-10-CM | POA: Diagnosis not present

## 2022-07-07 DIAGNOSIS — N3021 Other chronic cystitis with hematuria: Secondary | ICD-10-CM | POA: Diagnosis not present

## 2022-07-27 DIAGNOSIS — R8271 Bacteriuria: Secondary | ICD-10-CM | POA: Diagnosis not present

## 2022-07-27 DIAGNOSIS — N3021 Other chronic cystitis with hematuria: Secondary | ICD-10-CM | POA: Diagnosis not present

## 2022-08-02 DIAGNOSIS — N3289 Other specified disorders of bladder: Secondary | ICD-10-CM | POA: Diagnosis not present

## 2022-08-02 DIAGNOSIS — K760 Fatty (change of) liver, not elsewhere classified: Secondary | ICD-10-CM | POA: Diagnosis not present

## 2022-08-02 DIAGNOSIS — N3021 Other chronic cystitis with hematuria: Secondary | ICD-10-CM | POA: Diagnosis not present

## 2022-08-02 DIAGNOSIS — K5730 Diverticulosis of large intestine without perforation or abscess without bleeding: Secondary | ICD-10-CM | POA: Diagnosis not present

## 2022-08-29 DIAGNOSIS — N3021 Other chronic cystitis with hematuria: Secondary | ICD-10-CM | POA: Diagnosis not present

## 2022-09-12 DIAGNOSIS — L4050 Arthropathic psoriasis, unspecified: Secondary | ICD-10-CM | POA: Diagnosis not present

## 2022-09-12 DIAGNOSIS — M549 Dorsalgia, unspecified: Secondary | ICD-10-CM | POA: Diagnosis not present

## 2022-09-12 DIAGNOSIS — Z79899 Other long term (current) drug therapy: Secondary | ICD-10-CM | POA: Diagnosis not present

## 2022-09-12 DIAGNOSIS — M79643 Pain in unspecified hand: Secondary | ICD-10-CM | POA: Diagnosis not present

## 2022-09-12 DIAGNOSIS — Z13820 Encounter for screening for osteoporosis: Secondary | ICD-10-CM | POA: Diagnosis not present

## 2022-09-12 DIAGNOSIS — L409 Psoriasis, unspecified: Secondary | ICD-10-CM | POA: Diagnosis not present

## 2022-09-12 DIAGNOSIS — M1990 Unspecified osteoarthritis, unspecified site: Secondary | ICD-10-CM | POA: Diagnosis not present

## 2022-09-12 DIAGNOSIS — M797 Fibromyalgia: Secondary | ICD-10-CM | POA: Diagnosis not present

## 2022-10-07 DIAGNOSIS — E039 Hypothyroidism, unspecified: Secondary | ICD-10-CM | POA: Diagnosis not present

## 2022-10-07 DIAGNOSIS — E1122 Type 2 diabetes mellitus with diabetic chronic kidney disease: Secondary | ICD-10-CM | POA: Diagnosis not present

## 2022-10-07 DIAGNOSIS — K219 Gastro-esophageal reflux disease without esophagitis: Secondary | ICD-10-CM | POA: Diagnosis not present

## 2022-10-07 DIAGNOSIS — E7800 Pure hypercholesterolemia, unspecified: Secondary | ICD-10-CM | POA: Diagnosis not present

## 2022-10-07 DIAGNOSIS — N183 Chronic kidney disease, stage 3 unspecified: Secondary | ICD-10-CM | POA: Diagnosis not present

## 2022-10-07 DIAGNOSIS — Z0000 Encounter for general adult medical examination without abnormal findings: Secondary | ICD-10-CM | POA: Diagnosis not present

## 2022-10-07 DIAGNOSIS — I10 Essential (primary) hypertension: Secondary | ICD-10-CM | POA: Diagnosis not present

## 2022-10-07 DIAGNOSIS — D84821 Immunodeficiency due to drugs: Secondary | ICD-10-CM | POA: Diagnosis not present

## 2022-10-07 DIAGNOSIS — E119 Type 2 diabetes mellitus without complications: Secondary | ICD-10-CM | POA: Diagnosis not present

## 2022-10-07 DIAGNOSIS — E1165 Type 2 diabetes mellitus with hyperglycemia: Secondary | ICD-10-CM | POA: Diagnosis not present

## 2022-11-09 DIAGNOSIS — N952 Postmenopausal atrophic vaginitis: Secondary | ICD-10-CM | POA: Diagnosis not present

## 2022-11-09 DIAGNOSIS — Z01419 Encounter for gynecological examination (general) (routine) without abnormal findings: Secondary | ICD-10-CM | POA: Diagnosis not present

## 2022-11-09 DIAGNOSIS — Z6831 Body mass index (BMI) 31.0-31.9, adult: Secondary | ICD-10-CM | POA: Diagnosis not present

## 2022-11-09 DIAGNOSIS — Z1231 Encounter for screening mammogram for malignant neoplasm of breast: Secondary | ICD-10-CM | POA: Diagnosis not present

## 2022-11-22 DIAGNOSIS — N952 Postmenopausal atrophic vaginitis: Secondary | ICD-10-CM | POA: Diagnosis not present

## 2022-11-22 DIAGNOSIS — N3021 Other chronic cystitis with hematuria: Secondary | ICD-10-CM | POA: Diagnosis not present

## 2022-11-30 DIAGNOSIS — N3021 Other chronic cystitis with hematuria: Secondary | ICD-10-CM | POA: Diagnosis not present

## 2022-11-30 DIAGNOSIS — N952 Postmenopausal atrophic vaginitis: Secondary | ICD-10-CM | POA: Diagnosis not present

## 2022-11-30 DIAGNOSIS — R8279 Other abnormal findings on microbiological examination of urine: Secondary | ICD-10-CM | POA: Diagnosis not present

## 2022-12-12 DIAGNOSIS — Z13820 Encounter for screening for osteoporosis: Secondary | ICD-10-CM | POA: Diagnosis not present

## 2022-12-12 DIAGNOSIS — M549 Dorsalgia, unspecified: Secondary | ICD-10-CM | POA: Diagnosis not present

## 2022-12-12 DIAGNOSIS — Z79899 Other long term (current) drug therapy: Secondary | ICD-10-CM | POA: Diagnosis not present

## 2022-12-12 DIAGNOSIS — M79643 Pain in unspecified hand: Secondary | ICD-10-CM | POA: Diagnosis not present

## 2022-12-12 DIAGNOSIS — L409 Psoriasis, unspecified: Secondary | ICD-10-CM | POA: Diagnosis not present

## 2022-12-12 DIAGNOSIS — M797 Fibromyalgia: Secondary | ICD-10-CM | POA: Diagnosis not present

## 2022-12-12 DIAGNOSIS — M1990 Unspecified osteoarthritis, unspecified site: Secondary | ICD-10-CM | POA: Diagnosis not present

## 2022-12-12 DIAGNOSIS — L4050 Arthropathic psoriasis, unspecified: Secondary | ICD-10-CM | POA: Diagnosis not present

## 2022-12-13 DIAGNOSIS — D2262 Melanocytic nevi of left upper limb, including shoulder: Secondary | ICD-10-CM | POA: Diagnosis not present

## 2022-12-13 DIAGNOSIS — L821 Other seborrheic keratosis: Secondary | ICD-10-CM | POA: Diagnosis not present

## 2022-12-13 DIAGNOSIS — L718 Other rosacea: Secondary | ICD-10-CM | POA: Diagnosis not present

## 2022-12-13 DIAGNOSIS — L905 Scar conditions and fibrosis of skin: Secondary | ICD-10-CM | POA: Diagnosis not present

## 2022-12-13 DIAGNOSIS — D224 Melanocytic nevi of scalp and neck: Secondary | ICD-10-CM | POA: Diagnosis not present

## 2022-12-13 DIAGNOSIS — D225 Melanocytic nevi of trunk: Secondary | ICD-10-CM | POA: Diagnosis not present

## 2022-12-13 DIAGNOSIS — Z85828 Personal history of other malignant neoplasm of skin: Secondary | ICD-10-CM | POA: Diagnosis not present

## 2022-12-13 DIAGNOSIS — D2261 Melanocytic nevi of right upper limb, including shoulder: Secondary | ICD-10-CM | POA: Diagnosis not present

## 2022-12-13 DIAGNOSIS — D2362 Other benign neoplasm of skin of left upper limb, including shoulder: Secondary | ICD-10-CM | POA: Diagnosis not present

## 2022-12-22 DIAGNOSIS — N3021 Other chronic cystitis with hematuria: Secondary | ICD-10-CM | POA: Diagnosis not present

## 2022-12-29 DIAGNOSIS — M25522 Pain in left elbow: Secondary | ICD-10-CM | POA: Diagnosis not present

## 2022-12-29 DIAGNOSIS — M79672 Pain in left foot: Secondary | ICD-10-CM | POA: Diagnosis not present

## 2023-02-20 DIAGNOSIS — M79671 Pain in right foot: Secondary | ICD-10-CM | POA: Diagnosis not present

## 2023-02-28 DIAGNOSIS — N952 Postmenopausal atrophic vaginitis: Secondary | ICD-10-CM | POA: Diagnosis not present

## 2023-02-28 DIAGNOSIS — N3021 Other chronic cystitis with hematuria: Secondary | ICD-10-CM | POA: Diagnosis not present

## 2023-03-27 DIAGNOSIS — M797 Fibromyalgia: Secondary | ICD-10-CM | POA: Diagnosis not present

## 2023-03-27 DIAGNOSIS — M549 Dorsalgia, unspecified: Secondary | ICD-10-CM | POA: Diagnosis not present

## 2023-03-27 DIAGNOSIS — L409 Psoriasis, unspecified: Secondary | ICD-10-CM | POA: Diagnosis not present

## 2023-03-27 DIAGNOSIS — L4050 Arthropathic psoriasis, unspecified: Secondary | ICD-10-CM | POA: Diagnosis not present

## 2023-03-27 DIAGNOSIS — M1990 Unspecified osteoarthritis, unspecified site: Secondary | ICD-10-CM | POA: Diagnosis not present

## 2023-03-27 DIAGNOSIS — Z79899 Other long term (current) drug therapy: Secondary | ICD-10-CM | POA: Diagnosis not present

## 2023-03-27 DIAGNOSIS — M79643 Pain in unspecified hand: Secondary | ICD-10-CM | POA: Diagnosis not present

## 2023-04-10 DIAGNOSIS — L4050 Arthropathic psoriasis, unspecified: Secondary | ICD-10-CM | POA: Diagnosis not present

## 2023-04-10 DIAGNOSIS — M797 Fibromyalgia: Secondary | ICD-10-CM | POA: Diagnosis not present

## 2023-04-13 DIAGNOSIS — M549 Dorsalgia, unspecified: Secondary | ICD-10-CM | POA: Diagnosis not present

## 2023-04-13 DIAGNOSIS — M7989 Other specified soft tissue disorders: Secondary | ICD-10-CM | POA: Diagnosis not present

## 2023-04-13 DIAGNOSIS — L4050 Arthropathic psoriasis, unspecified: Secondary | ICD-10-CM | POA: Diagnosis not present

## 2023-04-13 DIAGNOSIS — M79643 Pain in unspecified hand: Secondary | ICD-10-CM | POA: Diagnosis not present

## 2023-04-13 DIAGNOSIS — M1990 Unspecified osteoarthritis, unspecified site: Secondary | ICD-10-CM | POA: Diagnosis not present

## 2023-04-13 DIAGNOSIS — Z79899 Other long term (current) drug therapy: Secondary | ICD-10-CM | POA: Diagnosis not present

## 2023-04-13 DIAGNOSIS — H10023 Other mucopurulent conjunctivitis, bilateral: Secondary | ICD-10-CM | POA: Diagnosis not present

## 2023-04-13 DIAGNOSIS — L409 Psoriasis, unspecified: Secondary | ICD-10-CM | POA: Diagnosis not present

## 2023-04-13 DIAGNOSIS — M797 Fibromyalgia: Secondary | ICD-10-CM | POA: Diagnosis not present

## 2023-04-14 DIAGNOSIS — H25013 Cortical age-related cataract, bilateral: Secondary | ICD-10-CM | POA: Diagnosis not present

## 2023-04-14 DIAGNOSIS — H2513 Age-related nuclear cataract, bilateral: Secondary | ICD-10-CM | POA: Diagnosis not present

## 2023-04-14 DIAGNOSIS — H10013 Acute follicular conjunctivitis, bilateral: Secondary | ICD-10-CM | POA: Diagnosis not present

## 2023-04-25 DIAGNOSIS — N183 Chronic kidney disease, stage 3 unspecified: Secondary | ICD-10-CM | POA: Diagnosis not present

## 2023-04-25 DIAGNOSIS — E1122 Type 2 diabetes mellitus with diabetic chronic kidney disease: Secondary | ICD-10-CM | POA: Diagnosis not present

## 2023-04-25 DIAGNOSIS — K219 Gastro-esophageal reflux disease without esophagitis: Secondary | ICD-10-CM | POA: Diagnosis not present

## 2023-04-25 DIAGNOSIS — E039 Hypothyroidism, unspecified: Secondary | ICD-10-CM | POA: Diagnosis not present

## 2023-04-25 DIAGNOSIS — E7800 Pure hypercholesterolemia, unspecified: Secondary | ICD-10-CM | POA: Diagnosis not present

## 2023-04-25 DIAGNOSIS — E1165 Type 2 diabetes mellitus with hyperglycemia: Secondary | ICD-10-CM | POA: Diagnosis not present

## 2023-04-25 DIAGNOSIS — L4050 Arthropathic psoriasis, unspecified: Secondary | ICD-10-CM | POA: Diagnosis not present

## 2023-04-25 DIAGNOSIS — D84821 Immunodeficiency due to drugs: Secondary | ICD-10-CM | POA: Diagnosis not present

## 2023-04-25 DIAGNOSIS — I10 Essential (primary) hypertension: Secondary | ICD-10-CM | POA: Diagnosis not present

## 2023-05-01 DIAGNOSIS — L905 Scar conditions and fibrosis of skin: Secondary | ICD-10-CM | POA: Diagnosis not present

## 2023-05-01 DIAGNOSIS — D485 Neoplasm of uncertain behavior of skin: Secondary | ICD-10-CM | POA: Diagnosis not present

## 2023-05-01 DIAGNOSIS — L570 Actinic keratosis: Secondary | ICD-10-CM | POA: Diagnosis not present

## 2023-05-01 DIAGNOSIS — Z85828 Personal history of other malignant neoplasm of skin: Secondary | ICD-10-CM | POA: Diagnosis not present

## 2023-05-01 DIAGNOSIS — L400 Psoriasis vulgaris: Secondary | ICD-10-CM | POA: Diagnosis not present

## 2023-05-01 DIAGNOSIS — L821 Other seborrheic keratosis: Secondary | ICD-10-CM | POA: Diagnosis not present

## 2023-05-01 DIAGNOSIS — D0439 Carcinoma in situ of skin of other parts of face: Secondary | ICD-10-CM | POA: Diagnosis not present

## 2023-05-09 DIAGNOSIS — M79641 Pain in right hand: Secondary | ICD-10-CM | POA: Diagnosis not present

## 2023-05-09 DIAGNOSIS — M549 Dorsalgia, unspecified: Secondary | ICD-10-CM | POA: Diagnosis not present

## 2023-05-09 DIAGNOSIS — M79642 Pain in left hand: Secondary | ICD-10-CM | POA: Diagnosis not present

## 2023-05-09 DIAGNOSIS — M797 Fibromyalgia: Secondary | ICD-10-CM | POA: Diagnosis not present

## 2023-05-09 DIAGNOSIS — M79643 Pain in unspecified hand: Secondary | ICD-10-CM | POA: Diagnosis not present

## 2023-05-09 DIAGNOSIS — M7989 Other specified soft tissue disorders: Secondary | ICD-10-CM | POA: Diagnosis not present

## 2023-05-09 DIAGNOSIS — N3021 Other chronic cystitis with hematuria: Secondary | ICD-10-CM | POA: Diagnosis not present

## 2023-05-09 DIAGNOSIS — N952 Postmenopausal atrophic vaginitis: Secondary | ICD-10-CM | POA: Diagnosis not present

## 2023-05-09 DIAGNOSIS — Z79899 Other long term (current) drug therapy: Secondary | ICD-10-CM | POA: Diagnosis not present

## 2023-05-09 DIAGNOSIS — M542 Cervicalgia: Secondary | ICD-10-CM | POA: Diagnosis not present

## 2023-05-09 DIAGNOSIS — L409 Psoriasis, unspecified: Secondary | ICD-10-CM | POA: Diagnosis not present

## 2023-05-09 DIAGNOSIS — M1990 Unspecified osteoarthritis, unspecified site: Secondary | ICD-10-CM | POA: Diagnosis not present

## 2023-05-09 DIAGNOSIS — L4050 Arthropathic psoriasis, unspecified: Secondary | ICD-10-CM | POA: Diagnosis not present

## 2023-05-15 DIAGNOSIS — H2513 Age-related nuclear cataract, bilateral: Secondary | ICD-10-CM | POA: Diagnosis not present

## 2023-05-15 DIAGNOSIS — H02422 Myogenic ptosis of left eyelid: Secondary | ICD-10-CM | POA: Diagnosis not present

## 2023-05-15 DIAGNOSIS — H16223 Keratoconjunctivitis sicca, not specified as Sjogren's, bilateral: Secondary | ICD-10-CM | POA: Diagnosis not present

## 2023-05-15 DIAGNOSIS — H25013 Cortical age-related cataract, bilateral: Secondary | ICD-10-CM | POA: Diagnosis not present

## 2023-05-22 DIAGNOSIS — M79642 Pain in left hand: Secondary | ICD-10-CM | POA: Diagnosis not present

## 2023-05-22 DIAGNOSIS — M79641 Pain in right hand: Secondary | ICD-10-CM | POA: Diagnosis not present

## 2023-05-25 DIAGNOSIS — I10 Essential (primary) hypertension: Secondary | ICD-10-CM | POA: Diagnosis not present

## 2023-05-25 DIAGNOSIS — L4050 Arthropathic psoriasis, unspecified: Secondary | ICD-10-CM | POA: Diagnosis not present

## 2023-05-25 DIAGNOSIS — E1140 Type 2 diabetes mellitus with diabetic neuropathy, unspecified: Secondary | ICD-10-CM | POA: Diagnosis not present

## 2023-05-25 DIAGNOSIS — D84821 Immunodeficiency due to drugs: Secondary | ICD-10-CM | POA: Diagnosis not present

## 2023-05-25 DIAGNOSIS — E039 Hypothyroidism, unspecified: Secondary | ICD-10-CM | POA: Diagnosis not present

## 2023-05-25 DIAGNOSIS — N390 Urinary tract infection, site not specified: Secondary | ICD-10-CM | POA: Diagnosis not present

## 2023-06-02 DIAGNOSIS — H16223 Keratoconjunctivitis sicca, not specified as Sjogren's, bilateral: Secondary | ICD-10-CM | POA: Diagnosis not present

## 2023-06-02 DIAGNOSIS — E119 Type 2 diabetes mellitus without complications: Secondary | ICD-10-CM | POA: Diagnosis not present

## 2023-06-02 DIAGNOSIS — H25013 Cortical age-related cataract, bilateral: Secondary | ICD-10-CM | POA: Diagnosis not present

## 2023-06-02 DIAGNOSIS — H02422 Myogenic ptosis of left eyelid: Secondary | ICD-10-CM | POA: Diagnosis not present

## 2023-06-19 DIAGNOSIS — N3021 Other chronic cystitis with hematuria: Secondary | ICD-10-CM | POA: Diagnosis not present

## 2023-06-19 DIAGNOSIS — N952 Postmenopausal atrophic vaginitis: Secondary | ICD-10-CM | POA: Diagnosis not present

## 2023-06-26 DIAGNOSIS — L4059 Other psoriatic arthropathy: Secondary | ICD-10-CM | POA: Diagnosis not present

## 2023-06-26 DIAGNOSIS — M1990 Unspecified osteoarthritis, unspecified site: Secondary | ICD-10-CM | POA: Diagnosis not present

## 2023-08-11 DIAGNOSIS — M7989 Other specified soft tissue disorders: Secondary | ICD-10-CM | POA: Diagnosis not present

## 2023-08-11 DIAGNOSIS — M1990 Unspecified osteoarthritis, unspecified site: Secondary | ICD-10-CM | POA: Diagnosis not present

## 2023-08-11 DIAGNOSIS — L4050 Arthropathic psoriasis, unspecified: Secondary | ICD-10-CM | POA: Diagnosis not present

## 2023-08-11 DIAGNOSIS — L409 Psoriasis, unspecified: Secondary | ICD-10-CM | POA: Diagnosis not present

## 2023-08-11 DIAGNOSIS — M549 Dorsalgia, unspecified: Secondary | ICD-10-CM | POA: Diagnosis not present

## 2023-08-11 DIAGNOSIS — M79643 Pain in unspecified hand: Secondary | ICD-10-CM | POA: Diagnosis not present

## 2023-08-11 DIAGNOSIS — M797 Fibromyalgia: Secondary | ICD-10-CM | POA: Diagnosis not present

## 2023-08-11 DIAGNOSIS — Z79899 Other long term (current) drug therapy: Secondary | ICD-10-CM | POA: Diagnosis not present

## 2023-08-28 DIAGNOSIS — N952 Postmenopausal atrophic vaginitis: Secondary | ICD-10-CM | POA: Diagnosis not present

## 2023-08-28 DIAGNOSIS — N3021 Other chronic cystitis with hematuria: Secondary | ICD-10-CM | POA: Diagnosis not present

## 2023-10-23 DIAGNOSIS — L4050 Arthropathic psoriasis, unspecified: Secondary | ICD-10-CM | POA: Diagnosis not present

## 2023-10-23 DIAGNOSIS — I10 Essential (primary) hypertension: Secondary | ICD-10-CM | POA: Diagnosis not present

## 2023-10-23 DIAGNOSIS — E7800 Pure hypercholesterolemia, unspecified: Secondary | ICD-10-CM | POA: Diagnosis not present

## 2023-10-23 DIAGNOSIS — K219 Gastro-esophageal reflux disease without esophagitis: Secondary | ICD-10-CM | POA: Diagnosis not present

## 2023-10-23 DIAGNOSIS — E1165 Type 2 diabetes mellitus with hyperglycemia: Secondary | ICD-10-CM | POA: Diagnosis not present

## 2023-10-23 DIAGNOSIS — E039 Hypothyroidism, unspecified: Secondary | ICD-10-CM | POA: Diagnosis not present

## 2023-10-23 DIAGNOSIS — N390 Urinary tract infection, site not specified: Secondary | ICD-10-CM | POA: Diagnosis not present

## 2023-11-10 DIAGNOSIS — M542 Cervicalgia: Secondary | ICD-10-CM | POA: Diagnosis not present

## 2023-11-10 DIAGNOSIS — M797 Fibromyalgia: Secondary | ICD-10-CM | POA: Diagnosis not present

## 2023-11-10 DIAGNOSIS — M549 Dorsalgia, unspecified: Secondary | ICD-10-CM | POA: Diagnosis not present

## 2023-11-10 DIAGNOSIS — L409 Psoriasis, unspecified: Secondary | ICD-10-CM | POA: Diagnosis not present

## 2023-11-10 DIAGNOSIS — M1990 Unspecified osteoarthritis, unspecified site: Secondary | ICD-10-CM | POA: Diagnosis not present

## 2023-11-10 DIAGNOSIS — M7989 Other specified soft tissue disorders: Secondary | ICD-10-CM | POA: Diagnosis not present

## 2023-11-10 DIAGNOSIS — L4050 Arthropathic psoriasis, unspecified: Secondary | ICD-10-CM | POA: Diagnosis not present

## 2023-11-10 DIAGNOSIS — Z79899 Other long term (current) drug therapy: Secondary | ICD-10-CM | POA: Diagnosis not present

## 2023-11-10 DIAGNOSIS — M79643 Pain in unspecified hand: Secondary | ICD-10-CM | POA: Diagnosis not present

## 2023-11-23 DIAGNOSIS — M47812 Spondylosis without myelopathy or radiculopathy, cervical region: Secondary | ICD-10-CM | POA: Diagnosis not present

## 2023-11-23 DIAGNOSIS — M542 Cervicalgia: Secondary | ICD-10-CM | POA: Diagnosis not present

## 2023-11-23 DIAGNOSIS — M7918 Myalgia, other site: Secondary | ICD-10-CM | POA: Diagnosis not present

## 2023-12-18 DIAGNOSIS — D2262 Melanocytic nevi of left upper limb, including shoulder: Secondary | ICD-10-CM | POA: Diagnosis not present

## 2023-12-18 DIAGNOSIS — D2362 Other benign neoplasm of skin of left upper limb, including shoulder: Secondary | ICD-10-CM | POA: Diagnosis not present

## 2023-12-18 DIAGNOSIS — L720 Epidermal cyst: Secondary | ICD-10-CM | POA: Diagnosis not present

## 2023-12-18 DIAGNOSIS — L814 Other melanin hyperpigmentation: Secondary | ICD-10-CM | POA: Diagnosis not present

## 2023-12-18 DIAGNOSIS — D225 Melanocytic nevi of trunk: Secondary | ICD-10-CM | POA: Diagnosis not present

## 2023-12-18 DIAGNOSIS — L821 Other seborrheic keratosis: Secondary | ICD-10-CM | POA: Diagnosis not present

## 2023-12-22 DIAGNOSIS — H2513 Age-related nuclear cataract, bilateral: Secondary | ICD-10-CM | POA: Diagnosis not present

## 2023-12-22 DIAGNOSIS — H25013 Cortical age-related cataract, bilateral: Secondary | ICD-10-CM | POA: Diagnosis not present

## 2023-12-22 DIAGNOSIS — H16223 Keratoconjunctivitis sicca, not specified as Sjogren's, bilateral: Secondary | ICD-10-CM | POA: Diagnosis not present

## 2023-12-22 DIAGNOSIS — H02422 Myogenic ptosis of left eyelid: Secondary | ICD-10-CM | POA: Diagnosis not present

## 2024-02-15 DIAGNOSIS — L409 Psoriasis, unspecified: Secondary | ICD-10-CM | POA: Diagnosis not present

## 2024-02-15 DIAGNOSIS — M1990 Unspecified osteoarthritis, unspecified site: Secondary | ICD-10-CM | POA: Diagnosis not present

## 2024-02-15 DIAGNOSIS — Z79899 Other long term (current) drug therapy: Secondary | ICD-10-CM | POA: Diagnosis not present

## 2024-02-15 DIAGNOSIS — L4050 Arthropathic psoriasis, unspecified: Secondary | ICD-10-CM | POA: Diagnosis not present

## 2024-02-15 DIAGNOSIS — M549 Dorsalgia, unspecified: Secondary | ICD-10-CM | POA: Diagnosis not present

## 2024-02-15 DIAGNOSIS — M79643 Pain in unspecified hand: Secondary | ICD-10-CM | POA: Diagnosis not present

## 2024-02-15 DIAGNOSIS — M797 Fibromyalgia: Secondary | ICD-10-CM | POA: Diagnosis not present

## 2024-03-26 DIAGNOSIS — L570 Actinic keratosis: Secondary | ICD-10-CM | POA: Diagnosis not present

## 2024-03-26 DIAGNOSIS — D485 Neoplasm of uncertain behavior of skin: Secondary | ICD-10-CM | POA: Diagnosis not present

## 2024-03-26 DIAGNOSIS — D0439 Carcinoma in situ of skin of other parts of face: Secondary | ICD-10-CM | POA: Diagnosis not present

## 2024-03-26 DIAGNOSIS — L820 Inflamed seborrheic keratosis: Secondary | ICD-10-CM | POA: Diagnosis not present

## 2024-03-26 DIAGNOSIS — L723 Sebaceous cyst: Secondary | ICD-10-CM | POA: Diagnosis not present

## 2024-04-19 DIAGNOSIS — Z6830 Body mass index (BMI) 30.0-30.9, adult: Secondary | ICD-10-CM | POA: Diagnosis not present

## 2024-04-19 DIAGNOSIS — N952 Postmenopausal atrophic vaginitis: Secondary | ICD-10-CM | POA: Diagnosis not present

## 2024-04-19 DIAGNOSIS — Z01419 Encounter for gynecological examination (general) (routine) without abnormal findings: Secondary | ICD-10-CM | POA: Diagnosis not present

## 2024-04-29 DIAGNOSIS — E7800 Pure hypercholesterolemia, unspecified: Secondary | ICD-10-CM | POA: Diagnosis not present

## 2024-04-29 DIAGNOSIS — I10 Essential (primary) hypertension: Secondary | ICD-10-CM | POA: Diagnosis not present

## 2024-04-29 DIAGNOSIS — E039 Hypothyroidism, unspecified: Secondary | ICD-10-CM | POA: Diagnosis not present

## 2024-04-29 DIAGNOSIS — N390 Urinary tract infection, site not specified: Secondary | ICD-10-CM | POA: Diagnosis not present

## 2024-04-29 DIAGNOSIS — Z23 Encounter for immunization: Secondary | ICD-10-CM | POA: Diagnosis not present

## 2024-04-29 DIAGNOSIS — L4050 Arthropathic psoriasis, unspecified: Secondary | ICD-10-CM | POA: Diagnosis not present

## 2024-04-29 DIAGNOSIS — M797 Fibromyalgia: Secondary | ICD-10-CM | POA: Diagnosis not present

## 2024-04-29 DIAGNOSIS — K219 Gastro-esophageal reflux disease without esophagitis: Secondary | ICD-10-CM | POA: Diagnosis not present

## 2024-04-29 DIAGNOSIS — Z0000 Encounter for general adult medical examination without abnormal findings: Secondary | ICD-10-CM | POA: Diagnosis not present

## 2024-04-29 DIAGNOSIS — E1165 Type 2 diabetes mellitus with hyperglycemia: Secondary | ICD-10-CM | POA: Diagnosis not present

## 2024-05-16 DIAGNOSIS — M797 Fibromyalgia: Secondary | ICD-10-CM | POA: Diagnosis not present

## 2024-05-16 DIAGNOSIS — M542 Cervicalgia: Secondary | ICD-10-CM | POA: Diagnosis not present

## 2024-05-16 DIAGNOSIS — L409 Psoriasis, unspecified: Secondary | ICD-10-CM | POA: Diagnosis not present

## 2024-05-16 DIAGNOSIS — Z79899 Other long term (current) drug therapy: Secondary | ICD-10-CM | POA: Diagnosis not present

## 2024-05-16 DIAGNOSIS — M1990 Unspecified osteoarthritis, unspecified site: Secondary | ICD-10-CM | POA: Diagnosis not present

## 2024-05-16 DIAGNOSIS — M549 Dorsalgia, unspecified: Secondary | ICD-10-CM | POA: Diagnosis not present

## 2024-05-16 DIAGNOSIS — L4050 Arthropathic psoriasis, unspecified: Secondary | ICD-10-CM | POA: Diagnosis not present
